# Patient Record
Sex: Male | Born: 1960 | Race: White | Hispanic: Yes | Marital: Married | Smoking: Never smoker
Health system: Southern US, Community
[De-identification: ages and names within clinical notes are randomized; demographics above are authoritative.]

## PROBLEM LIST (undated history)

## (undated) DIAGNOSIS — D649 Anemia, unspecified: Secondary | ICD-10-CM

## (undated) DIAGNOSIS — D696 Thrombocytopenia, unspecified: Secondary | ICD-10-CM

## (undated) DIAGNOSIS — Z87898 Personal history of other specified conditions: Secondary | ICD-10-CM

## (undated) DIAGNOSIS — R739 Hyperglycemia, unspecified: Secondary | ICD-10-CM

## (undated) DIAGNOSIS — K4020 Bilateral inguinal hernia, without obstruction or gangrene, not specified as recurrent: Secondary | ICD-10-CM

## (undated) DIAGNOSIS — T7840XA Allergy, unspecified, initial encounter: Secondary | ICD-10-CM

## (undated) HISTORY — DX: Bilateral inguinal hernia, without obstruction or gangrene, not specified as recurrent: K40.20

## (undated) HISTORY — DX: Hyperglycemia, unspecified: R73.9

## (undated) HISTORY — DX: Personal history of other specified conditions: Z87.898

## (undated) HISTORY — DX: Anemia, unspecified: D64.9

## (undated) HISTORY — DX: Allergy, unspecified, initial encounter: T78.40XA

## (undated) HISTORY — DX: Thrombocytopenia, unspecified: D69.6

---

## 1983-03-15 HISTORY — PX: EYE SURGERY: SHX253

## 2004-02-27 ENCOUNTER — Ambulatory Visit: Payer: Self-pay | Admitting: Family Medicine

## 2005-02-25 ENCOUNTER — Ambulatory Visit: Payer: Self-pay | Admitting: Family Medicine

## 2005-03-04 ENCOUNTER — Ambulatory Visit: Payer: Self-pay | Admitting: Family Medicine

## 2006-02-14 ENCOUNTER — Ambulatory Visit: Payer: Self-pay | Admitting: Family Medicine

## 2006-02-14 LAB — CONVERTED CEMR LAB
ALT: 25 units/L (ref 0–40)
Alkaline Phosphatase: 80 units/L (ref 39–117)
Basophils Absolute: 0 10*3/uL (ref 0.0–0.1)
Basophils Relative: 0.5 % (ref 0.0–1.0)
CO2: 30 meq/L (ref 19–32)
Calcium: 9.2 mg/dL (ref 8.4–10.5)
Chloride: 103 meq/L (ref 96–112)
Chol/HDL Ratio, serum: 3.2
GFR calc non Af Amer: 86 mL/min
Glomerular Filtration Rate, Af Am: 103 mL/min/{1.73_m2}
Glucose, Bld: 102 mg/dL — ABNORMAL HIGH (ref 70–99)
HDL: 58.6 mg/dL (ref >39.0)
LDL Cholesterol: 107 mg/dL — ABNORMAL HIGH (ref 0–99)
Lymphocytes Relative: 31 % (ref 12.0–46.0)
MCV: 86 fL (ref 78.0–100.0)
Monocytes Absolute: 0.7 10*3/uL (ref 0.2–0.7)
Monocytes Relative: 8.3 % (ref 3.0–11.0)
Neutro Abs: 4.8 10*3/uL (ref 1.4–7.7)
Platelets: 264 10*3/uL (ref 150–400)
RBC: 5.16 M/uL (ref 4.22–5.81)
Sodium: 140 meq/L (ref 135–145)
TSH: 1.66 microintl units/mL (ref 0.35–5.50)
VLDL: 22 mg/dL (ref 0–40)
WBC: 8.4 10*3/uL (ref 4.5–10.5)

## 2006-02-24 ENCOUNTER — Ambulatory Visit: Payer: Self-pay | Admitting: Family Medicine

## 2007-02-09 ENCOUNTER — Ambulatory Visit: Payer: Self-pay | Admitting: Family Medicine

## 2007-02-09 LAB — CONVERTED CEMR LAB
Nitrite: NEGATIVE
Specific Gravity, Urine: 1.02
Urobilinogen, UA: 0.2
WBC Urine, dipstick: NEGATIVE

## 2007-02-15 LAB — CONVERTED CEMR LAB
AST: 25 units/L (ref 0–37)
Albumin: 3.9 g/dL (ref 3.5–5.2)
Alkaline Phosphatase: 94 units/L (ref 39–117)
BUN: 18 mg/dL (ref 6–23)
Chloride: 100 meq/L (ref 96–112)
Creatinine, Ser: 0.8 mg/dL (ref 0.4–1.5)
GFR calc non Af Amer: 110 mL/min
HDL: 52.8 mg/dL (ref >39.0)
LDL Cholesterol: 102 mg/dL — ABNORMAL HIGH (ref 0–99)
Sodium: 138 meq/L (ref 135–145)
TSH: 1.6 microintl units/mL (ref 0.35–5.50)
VLDL: 24 mg/dL (ref 0–40)

## 2007-03-14 ENCOUNTER — Ambulatory Visit: Payer: Self-pay | Admitting: Family Medicine

## 2008-03-21 ENCOUNTER — Ambulatory Visit: Payer: Self-pay | Admitting: Family Medicine

## 2008-03-21 DIAGNOSIS — H109 Unspecified conjunctivitis: Secondary | ICD-10-CM | POA: Insufficient documentation

## 2008-03-21 DIAGNOSIS — B9789 Other viral agents as the cause of diseases classified elsewhere: Secondary | ICD-10-CM

## 2008-03-26 ENCOUNTER — Encounter: Payer: Self-pay | Admitting: Family Medicine

## 2008-03-26 DIAGNOSIS — R109 Unspecified abdominal pain: Secondary | ICD-10-CM | POA: Insufficient documentation

## 2008-03-27 LAB — CONVERTED CEMR LAB
AST: 81 units/L — ABNORMAL HIGH (ref 0–37)
Alkaline Phosphatase: 166 units/L — ABNORMAL HIGH (ref 39–117)
Basophils Absolute: 0.1 10*3/uL (ref 0.0–0.1)
Bilirubin, Direct: 0.1 mg/dL (ref 0.0–0.3)
CO2: 33 meq/L — ABNORMAL HIGH (ref 19–32)
Chloride: 106 meq/L (ref 96–112)
Cholesterol: 175 mg/dL (ref 0–200)
Eosinophils Absolute: 0.4 10*3/uL (ref 0.0–0.7)
GFR calc non Af Amer: 96 mL/min
HDL: 49.5 mg/dL (ref >39.0)
LDL Cholesterol: 110 mg/dL — ABNORMAL HIGH (ref 0–99)
Lymphocytes Relative: 24 % (ref 12.0–46.0)
MCHC: 34.4 g/dL (ref 30.0–36.0)
MCV: 84.8 fL (ref 78.0–100.0)
Neutrophils Relative %: 64.1 % (ref 43.0–77.0)
Platelets: 303 10*3/uL (ref 150–400)
Potassium: 4.7 meq/L (ref 3.5–5.1)
Total Bilirubin: 1.4 mg/dL — ABNORMAL HIGH (ref 0.3–1.2)
VLDL: 16 mg/dL (ref 0–40)
WBC: 9.7 10*3/uL (ref 4.5–10.5)

## 2008-04-02 ENCOUNTER — Telehealth: Payer: Self-pay | Admitting: Family Medicine

## 2008-04-04 ENCOUNTER — Ambulatory Visit (HOSPITAL_COMMUNITY): Admission: RE | Admit: 2008-04-04 | Discharge: 2008-04-04 | Payer: Self-pay | Admitting: Family Medicine

## 2009-03-14 HISTORY — PX: HERNIA REPAIR: SHX51

## 2009-08-21 IMAGING — US US ABDOMEN COMPLETE
1 series · 14 of 25 positions shown · non-contrast
Comparison: None

CLINICAL DATA: Abdominal pain.

ABDOMEN ULTRASOUND
TECHNIQUE: Complete abdominal ultrasound examination was performed
including evaluation of the liver, gallbladder, bile ducts,
pancreas, kidneys, spleen, IVC, and abdominal aorta.

[Series 1: unknown · 0.30mm/px · 14 of 60 slices shown]
[im 1/60]
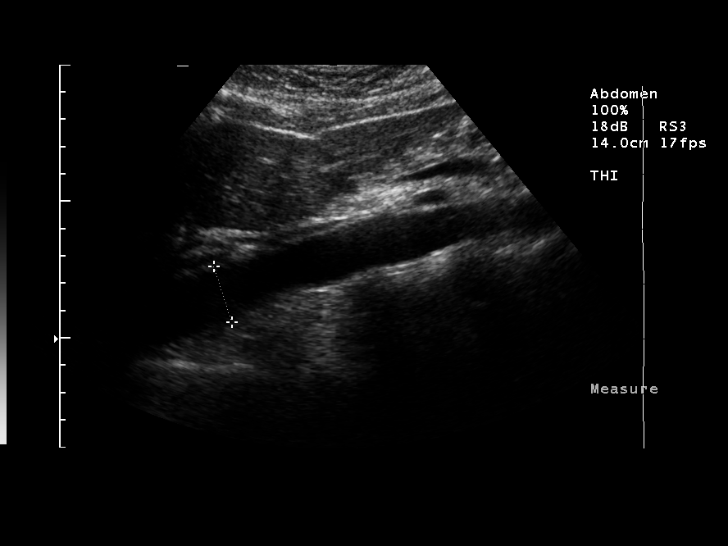
[im 5/60]
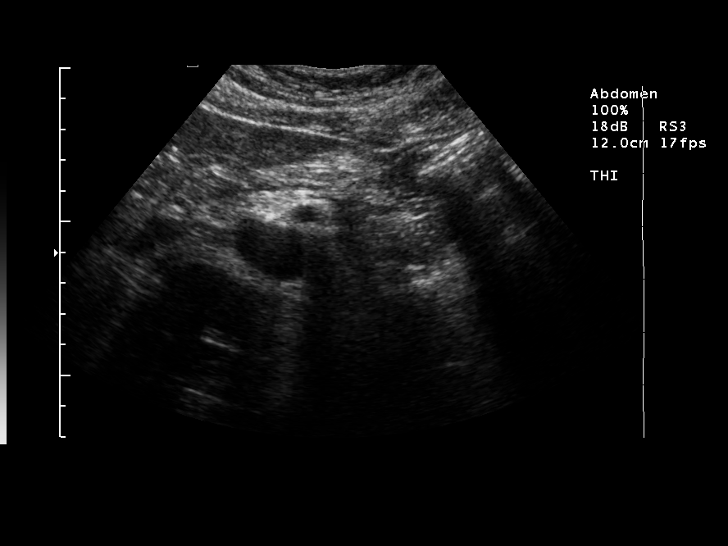
[im 10/60]
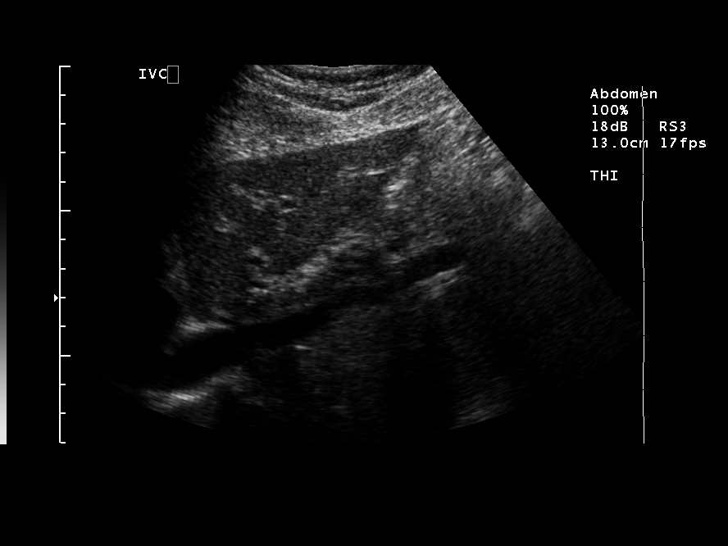
[im 15/60]
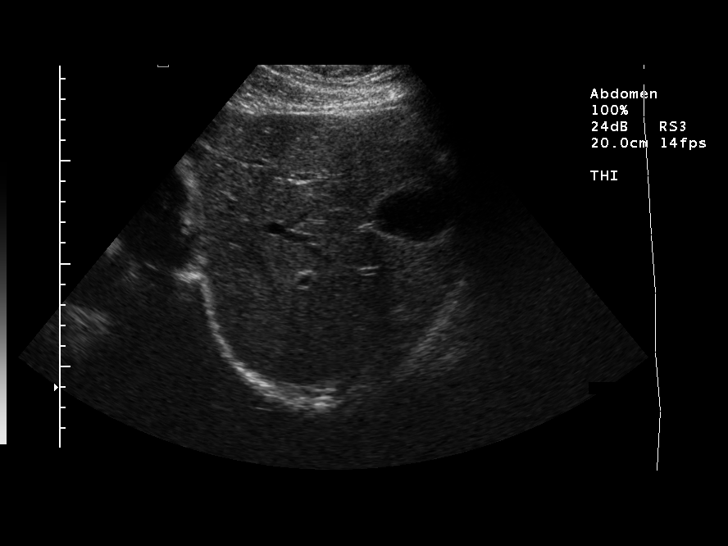
[im 20/60]
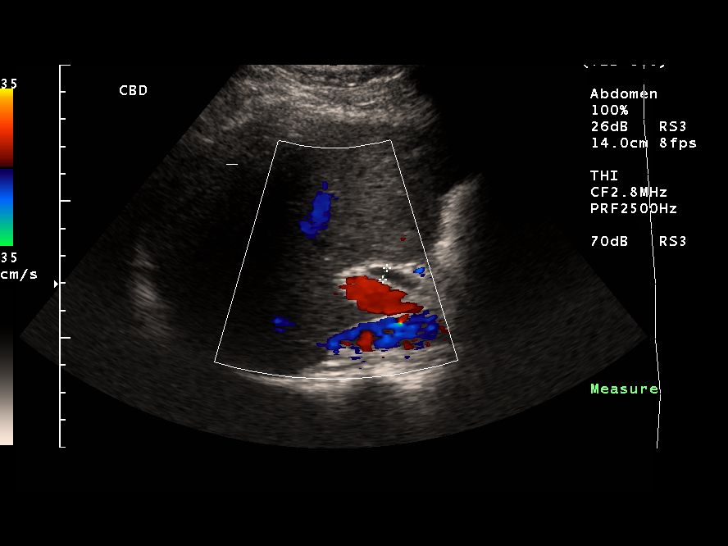
[im 23/60]
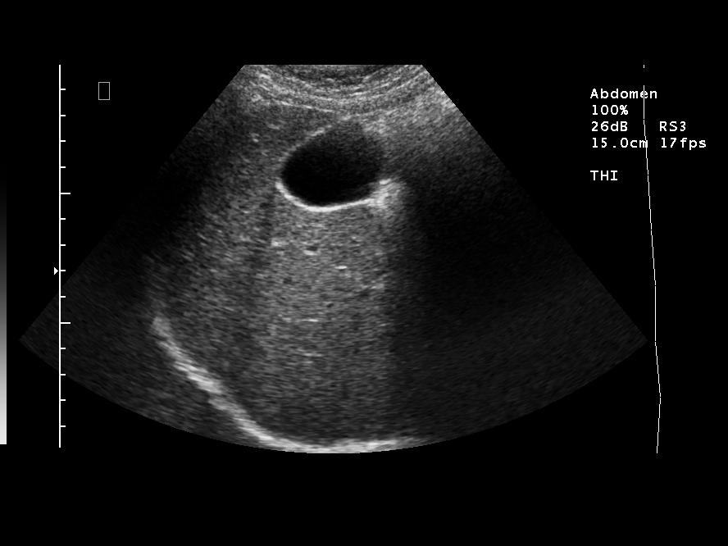
[im 28/60]
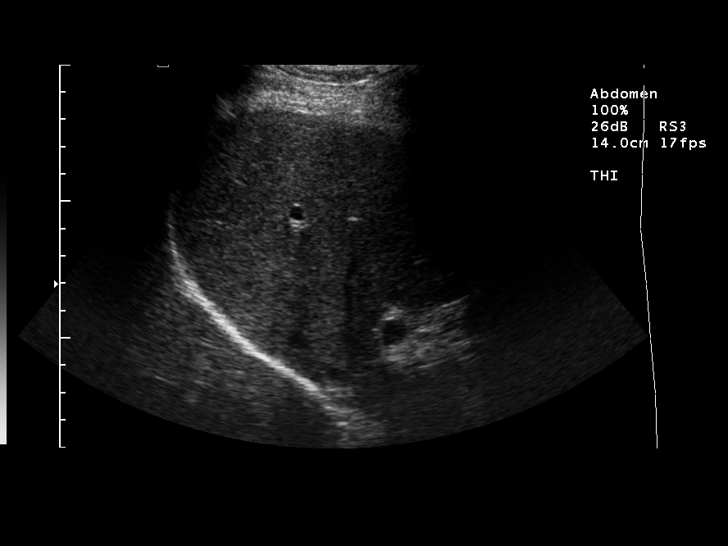
[im 32/60]
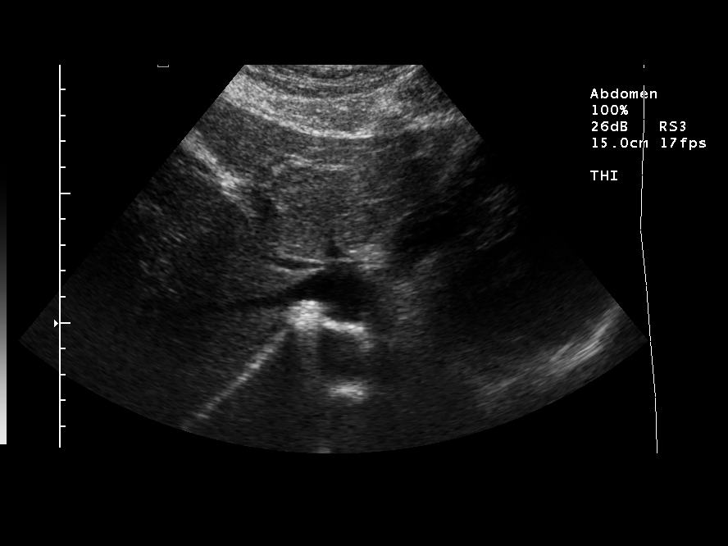
[im 37/60]
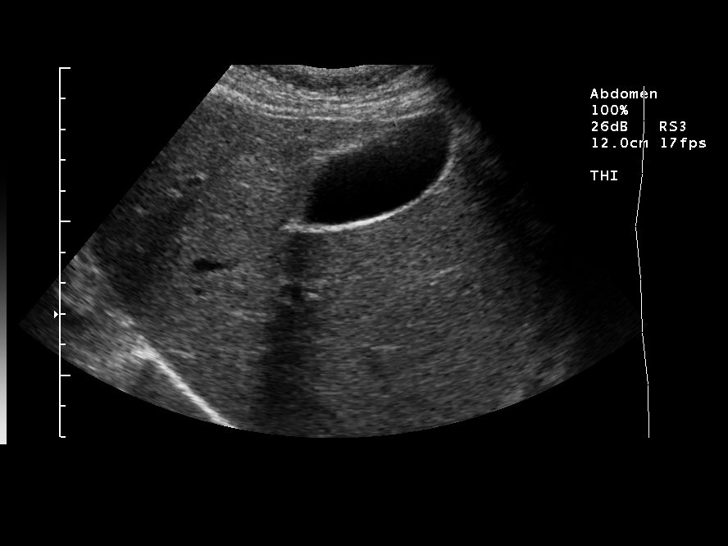
[im 40/60]
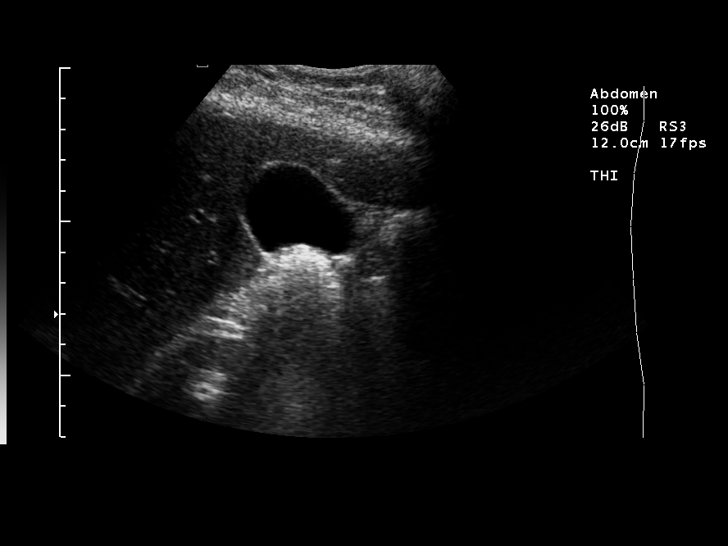
[im 45/60]
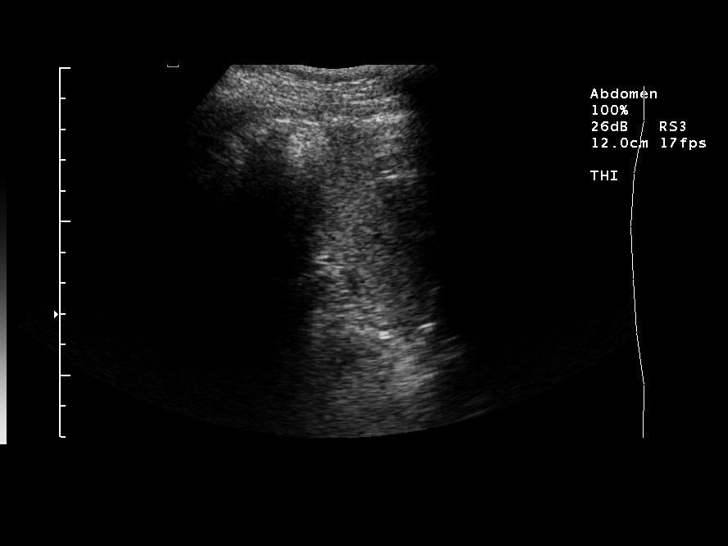
[im 50/60]
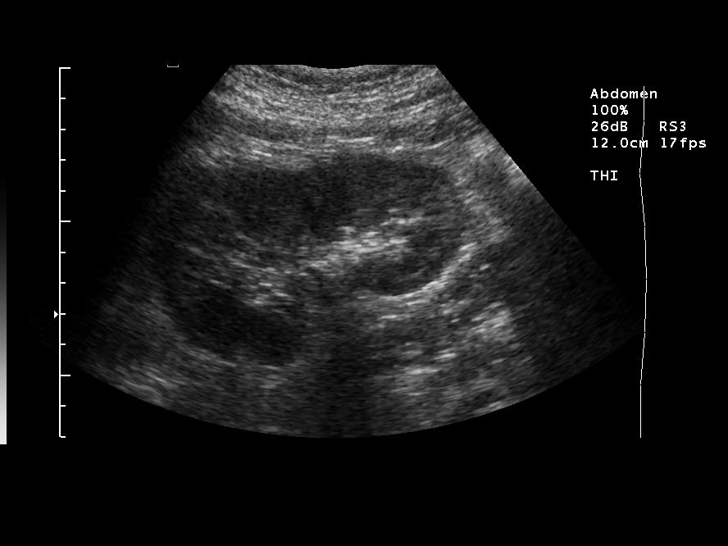
[im 55/60]
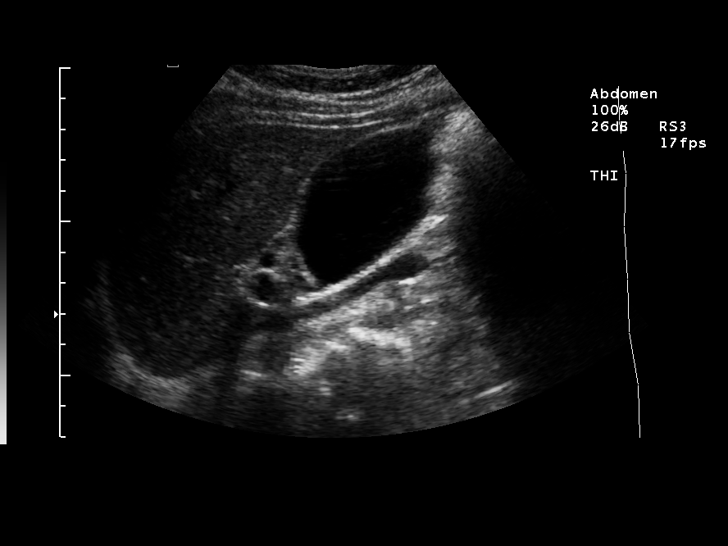
[im 60/60]
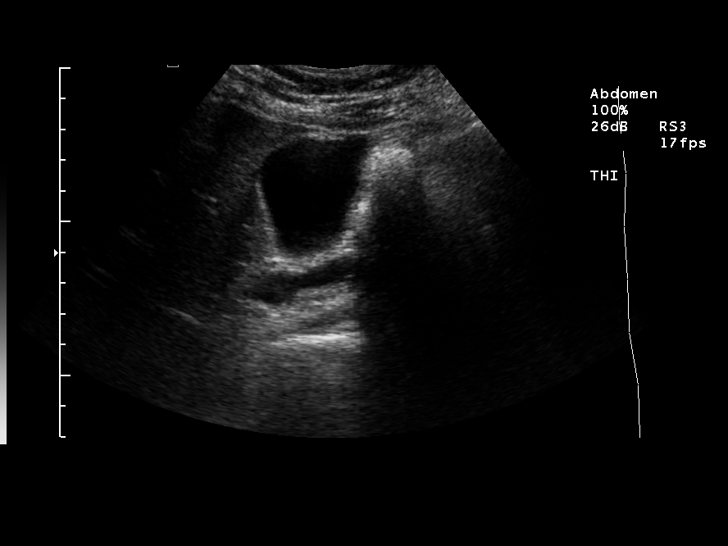

[14 of 25 positions shown; findings below may reference images not displayed]

FINDINGS: The gallbladder is well visualized and appears normal.
There are no dilated bile ducts.  Common bile duct is 4 mm in
diameter.

The liver parenchyma, inferior vena cava, pancreas, spleen,
kidneys, and abdominal aorta are normal.  The spleen is 5.3 cm in
length, the right kidney is 9.6 cm in length, and the left kidney
is 10.5 cm in length.  Maximal diameter of the abdominal aorta is
2.1 cm.
IMPRESSION: Normal abdominal ultrasound.

## 2010-07-30 NOTE — Assessment & Plan Note (Signed)
[facility] OFFICE NOTE   NAZARETH, Joseph Sexton                      MRN:          [id number]  DATE:02/24/2006                            DOB:          June 10, 1960    This is a 50 year old gentleman here for a complete physical  examination.  He feels well and has no complaints at all.  He has been  basically healthy with no ongoing problems.  For details of his past  medical history, family history, social history, habits, etc., refer to  our last physical, dated March 04, 2005.   ALLERGIES:  None.   MEDICATIONS:  None.   OBJECTIVE:  VITAL SIGNS:  Height 5 feet 4 inches, weight 145, BP 120/74,  pulse 88 and regular.  GENERAL:  He appears to be healthy.  SKIN:  Clear.  HEENT:  Eyes are clear, ears are clear, pharynx clear.  NECK:  Supple without lymphadenopathy or masses.  LUNGS:  Clear.  CARDIAC:  Rate and rhythm regular without gallops, murmurs, rubs.  Distal pulses full.  ABDOMEN:  Soft, normal bowel sounds, nontender, no masses.  GENITALIA:  Normal male.  EXTREMITIES:  No clubbing, cyanosis, or edema.  NEUROLOGIC:  Grossly intact.   He was here for fasting labs on December 4.  These were all within  normal limits.   ASSESSMENT AND PLAN:  Complete physical examination, which is  unremarkable.  Will plan on seeing him again in 1 year.     Tera Mater. Clent Ridges, MD  Electronically Signed    SAF/MedQ  DD: 02/27/2006  DT: 02/27/2006  Job #: [id number]

## 2010-09-12 LAB — HM COLONOSCOPY: HM COLON: NORMAL

## 2010-10-08 LAB — HM COLONOSCOPY

## 2011-03-15 LAB — HM COLONOSCOPY: HM COLON: NORMAL

## 2012-05-21 LAB — HEMOGLOBIN A1C: HEMOGLOBIN A1C: 6 % (ref 4.0–6.0)

## 2012-05-21 LAB — BASIC METABOLIC PANEL: Glucose: 107 mg/dL

## 2014-05-02 ENCOUNTER — Ambulatory Visit: Payer: Self-pay | Admitting: Internal Medicine

## 2014-05-22 ENCOUNTER — Telehealth: Payer: Self-pay | Admitting: *Deleted

## 2014-05-22 ENCOUNTER — Encounter: Payer: Self-pay | Admitting: *Deleted

## 2014-05-22 NOTE — Telephone Encounter (Signed)
Pre-Visit Call completed with patient and chart updated.   Pre-Visit Info documented in Specialty Comments under SnapShot.    

## 2014-05-23 ENCOUNTER — Ambulatory Visit (INDEPENDENT_AMBULATORY_CARE_PROVIDER_SITE_OTHER): Payer: 59 | Admitting: Internal Medicine

## 2014-05-23 ENCOUNTER — Encounter: Payer: Self-pay | Admitting: Internal Medicine

## 2014-05-23 VITALS — BP 128/72 | HR 77 | Temp 98.2°F | Ht 64.0 in | Wt 130.4 lb

## 2014-05-23 DIAGNOSIS — R739 Hyperglycemia, unspecified: Secondary | ICD-10-CM

## 2014-05-23 DIAGNOSIS — Z0000 Encounter for general adult medical examination without abnormal findings: Secondary | ICD-10-CM

## 2014-05-23 LAB — LIPID PANEL
CHOL/HDL RATIO: 2.3 ratio
Cholesterol: 160 mg/dL (ref 0–200)
HDL: 69 mg/dL (ref >=40)
LDL Cholesterol: 73 mg/dL (ref 0–99)
TRIGLYCERIDES: 92 mg/dL (ref <150)
VLDL: 18 mg/dL (ref 0–40)

## 2014-05-23 LAB — COMPREHENSIVE METABOLIC PANEL
ALBUMIN: 4.3 g/dL (ref 3.5–5.2)
ALT: 35 U/L (ref 0–53)
AST: 28 U/L (ref 0–37)
Alkaline Phosphatase: 75 U/L (ref 39–117)
BUN: 24 mg/dL — ABNORMAL HIGH (ref 6–23)
CALCIUM: 9.1 mg/dL (ref 8.4–10.5)
CHLORIDE: 101 meq/L (ref 96–112)
CO2: 28 meq/L (ref 19–32)
CREATININE: 0.96 mg/dL (ref 0.50–1.35)
Glucose, Bld: 91 mg/dL (ref 70–99)
POTASSIUM: 4.2 meq/L (ref 3.5–5.3)
Sodium: 138 mEq/L (ref 135–145)
Total Bilirubin: 0.7 mg/dL (ref 0.2–1.2)
Total Protein: 7 g/dL (ref 6.0–8.3)

## 2014-05-23 LAB — HEMOGLOBIN A1C
Hgb A1c MFr Bld: 5.8 % — ABNORMAL HIGH (ref <5.7)
Mean Plasma Glucose: 120 mg/dL — ABNORMAL HIGH (ref <117)

## 2014-05-23 LAB — TSH: TSH: 3.619 u[IU]/mL (ref 0.350–4.500)

## 2014-05-23 LAB — AST: AST: 28 U/L (ref 0–37)

## 2014-05-23 LAB — ALT: ALT: 35 U/L (ref 0–53)

## 2014-05-23 NOTE — Patient Instructions (Signed)
Get your blood work before you leave   Come back to the office in 1 year   for a physical exam  Please schedule an appointment at the front desk    Come back fasting       

## 2014-05-23 NOTE — Progress Notes (Signed)
Subjective:    Patient ID: Joseph Sexton, male    DOB: 07/30/1960, 54 y.o.   MRN: [id number]  DOS:  05/23/2014 Type of visit - description : nrew pt, CPX Interval history:    Review of Systems  Constitutional: No fever, chills. No unexplained wt changes. No unusual sweats HEENT: No dental problems, ear discharge, facial swelling, voice changes. No eye discharge, redness or intolerance to light; mild allergies on-off Respiratory: No wheezing or difficulty breathing. No cough , mucus production Cardiovascular: No CP, leg swelling or palpitations GI: no nausea, vomiting, diarrhea or abdominal pain.  No blood in the stools. No dysphagia   Endocrine: No polyphagia, polyuria or polydipsia GU: No dysuria, gross hematuria, difficulty urinating. No urinary urgency or frequency. Musculoskeletal: No joint swellings or unusual aches or pains Skin: No change in the color of the skin, palor or rash Allergic, immunologic: No environmental allergies or food allergies Neurological: occ episodes of vertigo-dizziness , no  syncope. No headaches. No diplopia, slurred speech, motor deficits, facial numbness Hematological: No enlarged lymph nodes, easy bruising or bleeding Psychiatry: No suicidal ideas, hallucinations, behavior problems or confusion. No unusual/severe anxiety or depression.    Past Medical History  Diagnosis Date  . Allergy     seasonal  . Anemia   . Hypoglycemia   . Thrombocytopenia   . H/O abnormal weight loss   . Inguinal hernia, bilateral     Past Surgical History  Procedure Laterality Date  . Hernia repair  2011  . Eye surgery  1985    unsure of exact surgery    History   Social History  . Marital Status: Married    Spouse Name: N/A  . Number of Children: N/A  . Years of Education: N/A   Occupational History  . Not on file.   Social History Main Topics  . Smoking status: Never Smoker   . Smokeless tobacco: Not on file  . Alcohol Use: 0.0 oz/week    0  Standard drinks or equivalent per week     Comment: Social Drinker  . Drug Use: No  . Sexual Activity: Not on file   Other Topics Concern  . Not on file   Social History Narrative     Family History  Problem Relation Age of Onset  . Hypertension Mother   . Heart disease Father [id number]  . Hypertension Sister   . CAD Neg Hx   . Diabetes Neg Hx   . Stroke Father [id number]  . Colon cancer Neg Hx   . Prostate cancer Neg Hx        Medication List       This list is accurate as of: 05/23/14  4:22 PM.  Always use your most recent med list.               B-12 1000 MCG Tbcr  Take by mouth.     cholecalciferol 1000 UNITS tablet  Commonly known as:  VITAMIN D  Take 1,000 Units by mouth daily.     Fish Oil 1200 MG Caps  Take by mouth.     JOINT FORMULA PO  Take 1 tablet by mouth daily.     multivitamin with minerals tablet  Take 1 tablet by mouth daily.     vitamin C 1000 MG tablet  Take 1,000 mg by mouth daily.           Objective:   Physical Exam BP 128/72 mmHg  Pulse 77  Temp(Src) 98.2 F (  36.8 C) (Oral)  Ht  (1.626 m)  Wt 130 lb 6 oz (59.138 kg)  BMI 22.37 kg/m2  SpO2 98% General:   Well developed, well nourished . NAD.  Neck:  Full range of motion. Supple. No  thyromegaly , normal carotid pulse HEENT:  Normocephalic . Face symmetric, atraumatic Lungs:  CTA B Normal respiratory effort, no intercostal retractions, no accessory muscle use. Heart: RRR,  no murmur.  Abdomen:  Not distended, soft, non-tender. No rebound or rigidity. No mass,organomegaly Muscle skeletal: no pretibial edema bilaterally  Skin: Exposed areas without rash. Not pale. Not jaundice pending Neurologic:  alert & oriented X3.  Speech normal, gait appropriate for age and unassisted Strength symmetric and appropriate for age.  Psych: Cognition and judgment appear intact.  Cooperative with normal attention span and concentration.  Behavior appropriate. No anxious or depressed  appearing.        Assessment & Plan:

## 2014-05-23 NOTE — Progress Notes (Signed)
Pre visit review using our clinic review tool, if applicable. No additional management support is needed unless otherwise documented below in the visit note. 

## 2014-05-23 NOTE — Assessment & Plan Note (Addendum)
Records from previous PCP reviewed:  tetanus shot 2009  colonoscopy 09-2010, Dr. Norma Fredricksonoledo  repeat in 10 years  Diet and exercise discussed Labs  history of hyperglycemia? Will check A1c

## 2014-05-24 LAB — CBC WITH DIFFERENTIAL/PLATELET
BASOS ABS: 0 10*3/uL (ref 0.0–0.1)
BASOS PCT: 0 % (ref 0–1)
Eosinophils Absolute: 0.1 10*3/uL (ref 0.0–0.7)
Eosinophils Relative: 1 % (ref 0–5)
HCT: 41.4 % (ref 39.0–52.0)
Hemoglobin: 13.9 g/dL (ref 13.0–17.0)
Lymphocytes Relative: 34 % (ref 12–46)
Lymphs Abs: 2.4 10*3/uL (ref 0.7–4.0)
MCH: 30.9 pg (ref 26.0–34.0)
MCHC: 33.6 g/dL (ref 30.0–36.0)
MCV: 92 fL (ref 78.0–100.0)
MONO ABS: 0.4 10*3/uL (ref 0.1–1.0)
MPV: 8.5 fL — ABNORMAL LOW (ref 8.6–12.4)
Monocytes Relative: 6 % (ref 3–12)
NEUTROS ABS: 4.2 10*3/uL (ref 1.7–7.7)
Neutrophils Relative %: 59 % (ref 43–77)
PLATELETS: 183 10*3/uL (ref 150–400)
RBC: 4.5 MIL/uL (ref 4.22–5.81)
RDW: 13.8 % (ref 11.5–15.5)
WBC: 7.2 10*3/uL (ref 4.0–10.5)

## 2014-05-24 LAB — PSA: PSA: 0.46 ng/mL (ref <=4.00)

## 2014-05-26 ENCOUNTER — Encounter: Payer: Self-pay | Admitting: Internal Medicine

## 2014-05-26 DIAGNOSIS — R739 Hyperglycemia, unspecified: Secondary | ICD-10-CM

## 2014-05-26 HISTORY — DX: Hyperglycemia, unspecified: R73.9

## 2015-03-03 ENCOUNTER — Telehealth: Payer: Self-pay | Admitting: Internal Medicine

## 2015-03-03 NOTE — Telephone Encounter (Signed)
Pt came in office and dropped off document to be filled out (2 Pages of Health Care Provider Confirmation of Physical Exam)

## 2015-03-04 NOTE — Telephone Encounter (Signed)
Filled out as much as possible and forwarded to Dr. Paz. JG//CMA  

## 2015-03-10 NOTE — Telephone Encounter (Signed)
Forms faxed successfully to [phone]. Originals placed up front for pt to pick up. Copy sent for scanning. JG//CMA

## 2015-06-09 ENCOUNTER — Telehealth: Payer: Self-pay | Admitting: Behavioral Health

## 2015-06-09 ENCOUNTER — Encounter: Payer: Self-pay | Admitting: Behavioral Health

## 2015-06-09 NOTE — Telephone Encounter (Signed)
Pre-Visit Call completed with patient and chart updated.   Pre-Visit Info documented in Specialty Comments under SnapShot.    

## 2015-06-10 ENCOUNTER — Ambulatory Visit (INDEPENDENT_AMBULATORY_CARE_PROVIDER_SITE_OTHER): Payer: 59 | Admitting: Internal Medicine

## 2015-06-10 ENCOUNTER — Encounter: Payer: Self-pay | Admitting: Internal Medicine

## 2015-06-10 VITALS — BP 126/66 | HR 53 | Temp 98.1°F | Ht 64.0 in | Wt 136.0 lb

## 2015-06-10 DIAGNOSIS — Z114 Encounter for screening for human immunodeficiency virus [HIV]: Secondary | ICD-10-CM | POA: Diagnosis not present

## 2015-06-10 DIAGNOSIS — Z0001 Encounter for general adult medical examination with abnormal findings: Secondary | ICD-10-CM

## 2015-06-10 DIAGNOSIS — Z0000 Encounter for general adult medical examination without abnormal findings: Secondary | ICD-10-CM

## 2015-06-10 DIAGNOSIS — Z09 Encounter for follow-up examination after completed treatment for conditions other than malignant neoplasm: Secondary | ICD-10-CM

## 2015-06-10 DIAGNOSIS — Z23 Encounter for immunization: Secondary | ICD-10-CM

## 2015-06-10 DIAGNOSIS — R399 Unspecified symptoms and signs involving the genitourinary system: Secondary | ICD-10-CM | POA: Diagnosis not present

## 2015-06-10 NOTE — Patient Instructions (Signed)
GO TO THE LAB :      Get the blood work     GO TO THE FRONT DESK Schedule your next appointment for a  physical When?   1 year Fasting?  Yes   

## 2015-06-10 NOTE — Assessment & Plan Note (Addendum)
TD-- 2009 pnm 23 - 3-17  colonoscopy 09-2010, Dr. Norma Fredricksonoledo  repeat in 10 years PSA wnl 2016, DRE today wnl Diet and exercise discussed Labs : Most recent FLP excellent, will check a HIV, BMP, A1c, TSH and urinalysis. RTC 1 year

## 2015-06-10 NOTE — Progress Notes (Signed)
Pre visit review using our clinic review tool, if applicable. No additional management support is needed unless otherwise documented below in the visit note. 

## 2015-06-10 NOTE — Progress Notes (Signed)
Subjective:    Patient ID: Joseph Sexton, male    DOB: Jan 26, 1961, 55 y.o.   MRN: [id number]  DOS:  06/10/2015 Type of visit - description :  Complete physical exam Interval history: No major concerns, feeling well    Review of Systems  Constitutional: No fever. No chills. No unexplained wt changes. No unusual sweats  HEENT: No dental problems, no ear discharge, no facial swelling, no voice changes. No eye discharge, no eye  redness , no  intolerance to light   Respiratory: No wheezing , no  difficulty breathing. No cough , no mucus production  Cardiovascular: No CP, no leg swelling , no  Palpitations  GI: no nausea, no vomiting, no diarrhea , no  abdominal pain.  No blood in the stools. No dysphagia, no odynophagia    Endocrine: No polyphagia, no polyuria , no polydipsia  GU: No dysuria, gross hematuria, difficulty urinating. For the last year has occasional nocturia and urinary urgency which is transient.  Musculoskeletal: No joint swellings or unusual aches or pains  Skin: No change in the color of the skin, palor , no  Rash  Allergic, immunologic: Allergies (nose, eyes) to pollen. OTCs help    Neurological: No dizziness no  syncope. No headaches. No diplopia, no slurred, no slurred speech, no motor deficits, no facial  Numbness  Hematological: No enlarged lymph nodes, no easy bruising , no unusual bleedings  Psychiatry: No suicidal ideas, no hallucinations, no beavior problems, no confusion.  No unusual/severe anxiety, no depression  Past Medical History  Diagnosis Date  . Allergy     seasonal  . Anemia   . Thrombocytopenia (HCC)     Per chart review  . H/O abnormal weight loss   . Inguinal hernia, bilateral   . Hyperglycemia 05/26/2014    Past Surgical History  Procedure Laterality Date  . Hernia repair  2011  . Eye surgery  1985    unsure of exact surgery    Social History   Social History  . Marital Status: Married    Spouse Name: N/A  . Number  of Children: 3  . Years of Education: N/A   Occupational History  . [employer]    Social History Main Topics  . Smoking status: Never Smoker   . Smokeless tobacco: Not on file  . Alcohol Use: 0.0 oz/week    0 Standard drinks or equivalent per week     Comment: Social Drinker  . Drug Use: No  . Sexual Activity: Not on file   Other Topics Concern  . Not on file   Social History Narrative   Born in [place] , in [place] since 1985     Family History  Problem Relation Age of Onset  . Hypertension Mother   . Heart disease Father 55  . Hypertension Sister   . Diabetes Neg Hx   . Stroke Father 58  . Colon cancer Neg Hx   . Prostate cancer Neg Hx        Medication List       This list is accurate as of: 06/10/15 11:59 PM.  Always use your most recent med list.               B-12 1000 MCG Tbcr  Take by mouth.     cholecalciferol 1000 units tablet  Commonly known as:  VITAMIN D  Take 1,000 Units by mouth daily.     Fish Oil 1200 MG Caps  Take by mouth.     JOINT FORMULA PO  Take 1 tablet by mouth daily.     multivitamin with minerals tablet  Take 1 tablet by mouth daily.     vitamin C 1000 MG tablet  Take 1,000 mg by mouth daily.           Objective:   Physical Exam BP 126/66 mmHg  Pulse 53  Temp(Src) 98.1 F (36.7 C) (Oral)  Ht 5\' 4"  (1.626 m)  Wt 136 lb (61.689 kg)  BMI 23.33 kg/m2  SpO2 97%  General:   Well developed, well nourished . NAD.  Neck: No  thyromegaly , normal carotid pulse HEENT:  Normocephalic . Face symmetric, atraumatic. Nose not congested, TMs normal, throat symmetric. Lungs:  CTA B Normal respiratory effort, no intercostal retractions, no accessory muscle use. Heart: RRR,  no murmur.  No pretibial edema bilaterally  Abdomen:  Not distended, soft, non-tender. No rebound or rigidity.   Rectal:  External abnormalities: none. Normal sphincter tone. No rectal masses or tenderness.  Stool brown    Prostate: Prostate gland firm and smooth, no enlargement, nodularity, tenderness, mass, asymmetry or induration.  Skin: Exposed areas without rash. Not pale. Not jaundice Neurologic:  alert & oriented X3.  Speech normal, gait appropriate for age and unassisted Strength symmetric and appropriate for age.  Psych: Cognition and judgment appear intact.  Cooperative with normal attention span and concentration.  Behavior appropriate. No anxious or depressed appearing.    Assessment & Plan:   Assessment Prediabetes A1c 6.0 05-2012 Allergic rhinitis  PLAN: Prediabetes: Diet and exercise discussed although he is doing very well. Check A1c Allergies: Responding well to OTCs. Mild LUTS: PSA last year normal, DRE negative today, recommend to avoid bladder irritants, drink plenty of fluids and call if sx increase. RTC 1 year

## 2015-06-11 DIAGNOSIS — Z09 Encounter for follow-up examination after completed treatment for conditions other than malignant neoplasm: Secondary | ICD-10-CM | POA: Insufficient documentation

## 2015-06-11 LAB — URINALYSIS, ROUTINE W REFLEX MICROSCOPIC
Bilirubin Urine: NEGATIVE
HGB URINE DIPSTICK: NEGATIVE
Ketones, ur: NEGATIVE
Leukocytes, UA: NEGATIVE
NITRITE: NEGATIVE
RBC / HPF: NONE SEEN (ref 0–?)
Specific Gravity, Urine: 1.025 (ref 1.000–1.030)
Total Protein, Urine: NEGATIVE
URINE GLUCOSE: NEGATIVE
Urobilinogen, UA: 0.2 (ref 0.0–1.0)
pH: 6 (ref 5.0–8.0)

## 2015-06-11 LAB — BASIC METABOLIC PANEL
BUN: 25 mg/dL — ABNORMAL HIGH (ref 6–23)
CALCIUM: 9.2 mg/dL (ref 8.4–10.5)
CO2: 30 mEq/L (ref 19–32)
CREATININE: 0.94 mg/dL (ref 0.40–1.50)
Chloride: 101 mEq/L (ref 96–112)
GFR: 88.16 mL/min (ref >60.00)
Glucose, Bld: 91 mg/dL (ref 70–99)
Potassium: 4 mEq/L (ref 3.5–5.1)
SODIUM: 137 meq/L (ref 135–145)

## 2015-06-11 LAB — HIV ANTIBODY (ROUTINE TESTING W REFLEX): HIV 1&2 Ab, 4th Generation: NONREACTIVE

## 2015-06-11 LAB — TSH: TSH: 2.7 u[IU]/mL (ref 0.35–4.50)

## 2015-06-11 LAB — HEMOGLOBIN A1C: Hgb A1c MFr Bld: 5.9 % (ref 4.6–6.5)

## 2015-06-11 NOTE — Assessment & Plan Note (Signed)
Prediabetes: Diet and exercise discussed although he is doing very well. Check A1c Allergies: Responding well to OTCs. Mild LUTS: PSA last year normal, DRE negative today, recommend to avoid bladder irritants, drink plenty of fluids and call if sx increase. RTC 1 year

## 2015-12-07 ENCOUNTER — Telehealth: Payer: Self-pay | Admitting: Internal Medicine

## 2015-12-07 NOTE — Telephone Encounter (Signed)
Pt's wife Joseph Sexton(Carmen) dropped off document to be filled out for spouse. (Document [employer]). Document put at front office tray.

## 2015-12-09 NOTE — Telephone Encounter (Signed)
Spoke w/ Pt, informed him form has been completed and placed at front desk for pick up at his convenience. Pt verbalized understanding. Copy of form sent for scanning.

## 2016-06-13 ENCOUNTER — Encounter: Payer: Self-pay | Admitting: Internal Medicine

## 2016-06-13 ENCOUNTER — Ambulatory Visit (INDEPENDENT_AMBULATORY_CARE_PROVIDER_SITE_OTHER): Payer: 59 | Admitting: Internal Medicine

## 2016-06-13 ENCOUNTER — Encounter: Payer: 59 | Admitting: Internal Medicine

## 2016-06-13 VITALS — BP 126/70 | HR 56 | Temp 97.8°F | Resp 14 | Ht 64.0 in | Wt 138.1 lb

## 2016-06-13 DIAGNOSIS — Z0000 Encounter for general adult medical examination without abnormal findings: Secondary | ICD-10-CM | POA: Diagnosis not present

## 2016-06-13 DIAGNOSIS — R739 Hyperglycemia, unspecified: Secondary | ICD-10-CM

## 2016-06-13 DIAGNOSIS — D696 Thrombocytopenia, unspecified: Secondary | ICD-10-CM

## 2016-06-13 DIAGNOSIS — Z1159 Encounter for screening for other viral diseases: Secondary | ICD-10-CM

## 2016-06-13 NOTE — Progress Notes (Signed)
Subjective:    Patient ID: Joseph Sexton, male    DOB: [birth year], 56 y.o.   MRN: [id number]  DOS:  06/13/2016 Type of visit - description : cpx Interval history: No major concerns   Review of Systems Few months ago, he got a splinter on the right index, he remove it, wonders if he still has a piece of wood there because it hurts from time to time. No redness, swelling or discharge   Other than above, a 14 point review of systems is negative     Past Medical History:  Diagnosis Date  . Allergy    seasonal  . Anemia   . H/O abnormal weight loss   . Hyperglycemia 05/26/2014  . Inguinal hernia, bilateral   . Thrombocytopenia (HCC)    Per chart review    Past Surgical History:  Procedure Laterality Date  . EYE SURGERY  1985   unsure of exact surgery  . HERNIA REPAIR Bilateral 2011    Social History   Social History  . Marital status: Married    Spouse name: N/A  . Number of children: 3  . Years of education: N/A   Occupational History  . [employer]    Social History Main Topics  . Smoking status: Never Smoker  . Smokeless tobacco: Never Used  . Alcohol use 0.0 oz/week     Comment: Social Drinker  . Drug use: No  . Sexual activity: Not on file   Other Topics Concern  . Not on file   Social History Narrative   Born in [place] , in [place] since 1985   2 daughters, one son     Family History  Problem Relation Age of Onset  . Hypertension Mother   . Heart disease Father 60  . Stroke Father 11  . Hypertension Sister   . Diabetes Neg Hx   . Colon cancer Neg Hx   . Prostate cancer Neg Hx      Allergies as of 06/13/2016      Reactions   Pollen Extract       Medication List       Accurate as of 06/13/16 11:59 PM. Always use your most recent med list.          B-12 1000 MCG Tbcr Take by mouth.   cholecalciferol 1000 units tablet Commonly known as:  VITAMIN D Take 1,000 Units by mouth daily.   Fish Oil 1200 MG Caps Take  by mouth.   JOINT FORMULA PO Take 1 tablet by mouth daily.   multivitamin with minerals tablet Take 1 tablet by mouth daily.   vitamin C 1000 MG tablet Take 1,000 mg by mouth daily.          Objective:   Physical Exam BP 126/70 (BP Location: Right Arm, Patient Position: Sitting, Cuff Size: Small)   Pulse (!) 56   Temp 97.8 F (36.6 C) (Oral)   Resp 14   Ht  (1.626 m)   Wt 138 lb 2 oz (62.7 kg)   SpO2 97%   BMI 23.71 kg/m   General:   Well developed, well nourished . NAD.  Neck: No  thyromegaly  HEENT:  Normocephalic . Face symmetric, atraumatic Lungs:  CTA B Normal respiratory effort, no intercostal retractions, no accessory muscle use. Heart: RRR,  no murmur.  No pretibial edema bilaterally  Abdomen:  Not distended, soft, non-tender. No rebound or rigidity.   Skin: Exposed areas without rash. Not pale.  Not jaundice MSK: Right index: Normal to inspection on palpation Neurologic:  alert & oriented X3.  Speech normal, gait appropriate for age and unassisted Strength symmetric and appropriate for age.  Psych: Cognition and judgment appear intact.  Cooperative with normal attention span and concentration.  Behavior appropriate. No anxious or depressed appearing.    Assessment & Plan:   Assessment Prediabetes A1c 6.0 05-2012 Allergic rhinitis  PLAN: Prediabetes: Diet control, check A1c Mild LUTS: Asx this year, denies any GU symptoms. RTC one year

## 2016-06-13 NOTE — Progress Notes (Signed)
Pre visit review using our clinic review tool, if applicable. No additional management support is needed unless otherwise documented below in the visit note. 

## 2016-06-13 NOTE — Patient Instructions (Signed)
GO TO THE LAB : Get the blood work     GO TO THE FRONT DESK Schedule your next appointment for a  Physical exam in 1 year 

## 2016-06-13 NOTE — Assessment & Plan Note (Addendum)
--  Td-- 2009; pnm 23: 3-17 --CCS: colonoscopy ~ 09-2010, Dr. Norma Fredrickson  repeat in 10 years --DRE wnl 2017, check a PSA  --Diet and exercise discussed --Labs : CMP, cholesterol panel, CBC, A1c, TSH, hep C. He is not fasting, if cholesterol elevated will come back fasting.

## 2016-06-14 LAB — CBC WITH DIFFERENTIAL/PLATELET
BASOS ABS: 0.1 10*3/uL (ref 0.0–0.1)
Basophils Relative: 0.8 % (ref 0.0–3.0)
EOS PCT: 0.7 % (ref 0.0–5.0)
Eosinophils Absolute: 0 10*3/uL (ref 0.0–0.7)
HEMATOCRIT: 38.1 % — AB (ref 39.0–52.0)
Hemoglobin: 12.9 g/dL — ABNORMAL LOW (ref 13.0–17.0)
LYMPHS ABS: 1.8 10*3/uL (ref 0.7–4.0)
LYMPHS PCT: 27.7 % (ref 12.0–46.0)
MCHC: 33.7 g/dL (ref 30.0–36.0)
MCV: 93 fl (ref 78.0–100.0)
MONOS PCT: 8.1 % (ref 3.0–12.0)
Monocytes Absolute: 0.5 10*3/uL (ref 0.1–1.0)
NEUTROS ABS: 4.2 10*3/uL (ref 1.4–7.7)
Neutrophils Relative %: 62.7 % (ref 43.0–77.0)
PLATELETS: 228 10*3/uL (ref 150.0–400.0)
RBC: 4.1 Mil/uL — AB (ref 4.22–5.81)
RDW: 14.9 % (ref 11.5–15.5)
WBC: 6.6 10*3/uL (ref 4.0–10.5)

## 2016-06-14 LAB — COMPREHENSIVE METABOLIC PANEL
ALK PHOS: 74 U/L (ref 39–117)
ALT: 15 U/L (ref 0–53)
AST: 18 U/L (ref 0–37)
Albumin: 4.1 g/dL (ref 3.5–5.2)
BILIRUBIN TOTAL: 0.5 mg/dL (ref 0.2–1.2)
BUN: 18 mg/dL (ref 6–23)
CO2: 30 mEq/L (ref 19–32)
Calcium: 8.9 mg/dL (ref 8.4–10.5)
Chloride: 104 mEq/L (ref 96–112)
Creatinine, Ser: 0.79 mg/dL (ref 0.40–1.50)
GFR: 107.36 mL/min (ref >60.00)
GLUCOSE: 95 mg/dL (ref 70–99)
Potassium: 4.3 mEq/L (ref 3.5–5.1)
SODIUM: 140 meq/L (ref 135–145)
TOTAL PROTEIN: 6.4 g/dL (ref 6.0–8.3)

## 2016-06-14 LAB — LIPID PANEL
Cholesterol: 158 mg/dL (ref 0–200)
HDL: 59.3 mg/dL (ref >39.00)
LDL Cholesterol: 68 mg/dL (ref 0–99)
NONHDL: 98.22
Total CHOL/HDL Ratio: 3
Triglycerides: 149 mg/dL (ref 0.0–149.0)
VLDL: 29.8 mg/dL (ref 0.0–40.0)

## 2016-06-14 LAB — HEMOGLOBIN A1C: Hgb A1c MFr Bld: 5.9 % (ref 4.6–6.5)

## 2016-06-14 LAB — PSA: PSA: 0.32 ng/mL (ref 0.10–4.00)

## 2016-06-14 LAB — HEPATITIS C ANTIBODY: HCV AB: NEGATIVE

## 2016-06-14 NOTE — Assessment & Plan Note (Signed)
Prediabetes: Diet control, check A1c Mild LUTS: Asx this year, denies any GU symptoms. RTC one year

## 2016-09-30 ENCOUNTER — Telehealth: Payer: Self-pay | Admitting: Internal Medicine

## 2016-09-30 NOTE — Telephone Encounter (Signed)
Pt dropped off physical form for Dr. Drue NovelPaz to fill out, pt will pick up when available, documents placed in tray at front office.

## 2016-10-03 NOTE — Telephone Encounter (Signed)
Health Care Provider Confirmation of Physical Exam, completed as much as possible; forwarded to provider/SLS 07/23

## 2016-10-07 NOTE — Telephone Encounter (Signed)
Informed Pt that form has been completed and placed at front desk for pick up at his convenience. Copy of forms sent for scanning.

## 2017-06-16 ENCOUNTER — Encounter: Payer: Self-pay | Admitting: Internal Medicine

## 2017-06-16 ENCOUNTER — Ambulatory Visit (INDEPENDENT_AMBULATORY_CARE_PROVIDER_SITE_OTHER): Payer: 59 | Admitting: Internal Medicine

## 2017-06-16 ENCOUNTER — Other Ambulatory Visit: Payer: Self-pay

## 2017-06-16 VITALS — BP 126/60 | HR 61 | Temp 98.0°F | Resp 14 | Ht 64.0 in | Wt 135.1 lb

## 2017-06-16 DIAGNOSIS — Z0000 Encounter for general adult medical examination without abnormal findings: Secondary | ICD-10-CM | POA: Diagnosis not present

## 2017-06-16 DIAGNOSIS — Z23 Encounter for immunization: Secondary | ICD-10-CM

## 2017-06-16 DIAGNOSIS — R739 Hyperglycemia, unspecified: Secondary | ICD-10-CM | POA: Diagnosis not present

## 2017-06-16 DIAGNOSIS — Z09 Encounter for follow-up examination after completed treatment for conditions other than malignant neoplasm: Secondary | ICD-10-CM

## 2017-06-16 DIAGNOSIS — E875 Hyperkalemia: Secondary | ICD-10-CM

## 2017-06-16 LAB — CBC WITH DIFFERENTIAL/PLATELET
Basophils Absolute: 0 10*3/uL (ref 0.0–0.1)
Basophils Relative: 0.4 % (ref 0.0–3.0)
EOS ABS: 0.1 10*3/uL (ref 0.0–0.7)
Eosinophils Relative: 1.1 % (ref 0.0–5.0)
HCT: 38.2 % — ABNORMAL LOW (ref 39.0–52.0)
HEMOGLOBIN: 13 g/dL (ref 13.0–17.0)
LYMPHS ABS: 1.8 10*3/uL (ref 0.7–4.0)
Lymphocytes Relative: 34.6 % (ref 12.0–46.0)
MCHC: 34.1 g/dL (ref 30.0–36.0)
MCV: 91.7 fl (ref 78.0–100.0)
MONO ABS: 0.4 10*3/uL (ref 0.1–1.0)
Monocytes Relative: 8 % (ref 3.0–12.0)
NEUTROS PCT: 55.9 % (ref 43.0–77.0)
Neutro Abs: 2.9 10*3/uL (ref 1.4–7.7)
PLATELETS: 286 10*3/uL (ref 150.0–400.0)
RBC: 4.16 Mil/uL — ABNORMAL LOW (ref 4.22–5.81)
RDW: 14.8 % (ref 11.5–15.5)
WBC: 5.2 10*3/uL (ref 4.0–10.5)

## 2017-06-16 LAB — HEMOGLOBIN A1C: HEMOGLOBIN A1C: 6 % (ref 4.6–6.5)

## 2017-06-16 LAB — BASIC METABOLIC PANEL
BUN: 23 mg/dL (ref 6–23)
CHLORIDE: 103 meq/L (ref 96–112)
CO2: 32 mEq/L (ref 19–32)
Calcium: 9.4 mg/dL (ref 8.4–10.5)
Creatinine, Ser: 0.91 mg/dL (ref 0.40–1.50)
GFR: 90.87 mL/min (ref >60.00)
GLUCOSE: 88 mg/dL (ref 70–99)
POTASSIUM: 5.6 meq/L — AB (ref 3.5–5.1)
SODIUM: 140 meq/L (ref 135–145)

## 2017-06-16 LAB — LIPID PANEL
CHOLESTEROL: 152 mg/dL (ref 0–200)
HDL: 67.1 mg/dL (ref >39.00)
LDL CALC: 70 mg/dL (ref 0–99)
NonHDL: 84.48
Total CHOL/HDL Ratio: 2
Triglycerides: 71 mg/dL (ref 0.0–149.0)
VLDL: 14.2 mg/dL (ref 0.0–40.0)

## 2017-06-16 NOTE — Assessment & Plan Note (Addendum)
--  Td: [phone] pnm 23: 2017 --CCS: colonoscopy ~ 09-2010, Dr. Norma Fredricksonoledo  repeat in 10 years --Prostate cancer screening DRE wnl 2017, PSA 2018 normal.  No FH, no symptoms.  Reassess next year --Diet and exercise discussed --Labs : BMP, FLP, CBC, A1c

## 2017-06-16 NOTE — Progress Notes (Signed)
Pre visit review using our clinic review tool, if applicable. No additional management support is needed unless otherwise documented below in the visit note. 

## 2017-06-16 NOTE — Patient Instructions (Signed)
GO TO THE LAB : Get the blood work     GO TO THE FRONT DESK Schedule your next appointment   for a physical exam in 1 year 

## 2017-06-16 NOTE — Progress Notes (Signed)
Subjective:    Patient ID: Joseph Sexton, male    DOB: 02/21/1961, 57 y.o.   MRN: [id number]  DOS:  06/16/2017 Type of visit - description : cpx Interval history: Feels well, no concerns  Wt Readings from Last 3 Encounters:  06/16/17 135 lb 2 oz (61.3 kg)  06/13/16 138 lb 2 oz (62.7 kg)  06/10/15 136 lb (61.7 kg)   Review of Systems  A 14 point review of systems is negative    Past Medical History:  Diagnosis Date  . Allergy    seasonal  . Anemia   . H/O abnormal weight loss   . Hyperglycemia 05/26/2014  . Inguinal hernia, bilateral   . Thrombocytopenia (HCC)    Per chart review    Past Surgical History:  Procedure Laterality Date  . EYE SURGERY  1985   unsure of exact surgery  . HERNIA REPAIR Bilateral 2011    Social History   Socioeconomic History  . Marital status: Married    Spouse name: Not on file  . Number of children: 3  . Years of education: Not on file  . Highest education level: Not on file  Occupational History  . Occupation: [employer]  Social Needs  . Financial resource strain: Not on file  . Food insecurity:    Worry: Not on file    Inability: Not on file  . Transportation needs:    Medical: Not on file    Non-medical: Not on file  Tobacco Use  . Smoking status: Never Smoker  . Smokeless tobacco: Never Used  Substance and Sexual Activity  . Alcohol use: Yes    Alcohol/week: 0.0 oz    Comment: Social Drinker  . Drug use: No  . Sexual activity: Not on file  Lifestyle  . Physical activity:    Days per week: Not on file    Minutes per session: Not on file  . Stress: Not on file  Relationships  . Social connections:    Talks on phone: Not on file    Gets together: Not on file    Attends religious service: Not on file    Active member of club or organization: Not on file    Attends meetings of clubs or organizations: Not on file    Relationship status: Not on file  . Intimate partner violence:    Fear of current or  ex partner: Not on file    Emotionally abused: Not on file    Physically abused: Not on file    Forced sexual activity: Not on file  Other Topics Concern  . Not on file  Social History Narrative   Born [place] , [place] since [place]   2 daughters, one son     Family History  Problem Relation Age of Onset  . Hypertension Mother   . Heart disease Father [id number]  . Stroke Father 6776  . Hypertension Sister   . Diabetes Neg Hx   . Colon cancer Neg Hx   . Prostate cancer Neg Hx      Allergies as of 06/16/2017      Reactions   Pollen Extract       Medication List        Accurate as of 06/16/17 11:59 PM. Always use your most recent med list.          B-12 1000 MCG Tbcr Take by mouth.   cholecalciferol 1000 units tablet Commonly known as:  VITAMIN D  Take 1,000 Units by mouth daily.   Fish Oil 1200 MG Caps Take by mouth.   JOINT FORMULA PO Take 1 tablet by mouth daily.   multivitamin with minerals tablet Take 1 tablet by mouth daily.   vitamin C 1000 MG tablet Take 1,000 mg by mouth daily.          Objective:   Physical Exam BP 126/60 (BP Location: Left Arm, Patient Position: Sitting, Cuff Size: Small)   Pulse 61   Temp 98 F (36.7 C) (Oral)   Resp 14   Ht 5\' 4"  (1.626 m)   Wt 135 lb 2 oz (61.3 kg)   SpO2 96%   BMI 23.19 kg/m  General:   Well developed, well nourished . NAD.  Neck: No  thyromegaly  HEENT:  Normocephalic . Face symmetric, atraumatic Lungs:  CTA B Normal respiratory effort, no intercostal retractions, no accessory muscle use. Heart: RRR,  no murmur.  No pretibial edema bilaterally  Abdomen:  Not distended, soft, non-tender. No rebound or rigidity.  Palpable, nontender aorta at the upper abdomen, no bruit Skin: Exposed areas without rash. Not pale. Not jaundice Neurologic:  alert & oriented X3.  Speech normal, gait appropriate for age and unassisted Strength symmetric and appropriate for age.  Psych: Cognition and judgment  appear intact.  Cooperative with normal attention span and concentration.  Behavior appropriate. No anxious or depressed appearing.     Assessment & Plan:   Assessment Prediabetes A1c 6.0 05-2012 Allergic rhinitis H/o thrombocytopenia Palpable aorta (US abdomen 2010, normal aorta)  PLAN: DM:: Diet discussed Allergic rhinitis, seasonal, reports that he has good response with OTCs Palpable aorta: Doubt AAA, likely due to patient habitus RTC 1 year

## 2017-06-18 NOTE — Assessment & Plan Note (Signed)
DM:: Diet discussed Allergic rhinitis, seasonal, reports that he has good response with OTCs Palpable aorta: Doubt AAA, likely due to patient habitus RTC 1 year

## 2017-06-20 ENCOUNTER — Telehealth: Payer: Self-pay | Admitting: Internal Medicine

## 2017-06-20 ENCOUNTER — Other Ambulatory Visit (INDEPENDENT_AMBULATORY_CARE_PROVIDER_SITE_OTHER): Payer: 59

## 2017-06-20 DIAGNOSIS — E875 Hyperkalemia: Secondary | ICD-10-CM | POA: Diagnosis not present

## 2017-06-20 LAB — BASIC METABOLIC PANEL
BUN: 26 mg/dL — AB (ref 6–23)
CALCIUM: 9 mg/dL (ref 8.4–10.5)
CHLORIDE: 103 meq/L (ref 96–112)
CO2: 30 mEq/L (ref 19–32)
CREATININE: 0.86 mg/dL (ref 0.40–1.50)
GFR: 96.99 mL/min (ref >60.00)
Glucose, Bld: 110 mg/dL — ABNORMAL HIGH (ref 70–99)
Potassium: 5.1 mEq/L (ref 3.5–5.1)
Sodium: 139 mEq/L (ref 135–145)

## 2017-06-20 NOTE — Telephone Encounter (Signed)
Copied from CRM [id number]. Topic: Quick Communication - See Telephone Encounter >> Jun 20, 2017  7:29 AM Valentina LucksMatos, Jackelin wrote: CRM for notification. See Telephone encounter for: 06/20/17.   Pt dropped off document to be filled out by provider ( 1 page of [employer]) Pt would like document to be faxed to [phone] and to call pt when document has been faxed. Document put at front office tray under providers name.

## 2017-06-21 NOTE — Telephone Encounter (Signed)
Form completed and faxed with confirmation/SLS 04/10

## 2017-06-21 NOTE — Telephone Encounter (Signed)
Completed as much as possible; forward to provider [not states that it needs to be signed]/SLS 04/10

## 2017-07-28 DIAGNOSIS — H40019 Open angle with borderline findings, low risk, unspecified eye: Secondary | ICD-10-CM | POA: Diagnosis not present

## 2017-10-06 DIAGNOSIS — H40013 Open angle with borderline findings, low risk, bilateral: Secondary | ICD-10-CM | POA: Diagnosis not present

## 2018-07-30 DIAGNOSIS — H40013 Open angle with borderline findings, low risk, bilateral: Secondary | ICD-10-CM | POA: Diagnosis not present

## 2018-10-09 ENCOUNTER — Encounter: Payer: Self-pay | Admitting: Internal Medicine

## 2019-01-17 ENCOUNTER — Other Ambulatory Visit: Payer: Self-pay

## 2019-01-18 ENCOUNTER — Other Ambulatory Visit: Payer: Self-pay

## 2019-01-18 ENCOUNTER — Encounter: Payer: Self-pay | Admitting: Internal Medicine

## 2019-01-18 ENCOUNTER — Ambulatory Visit (INDEPENDENT_AMBULATORY_CARE_PROVIDER_SITE_OTHER): Payer: 59 | Admitting: Internal Medicine

## 2019-01-18 VITALS — BP 122/75 | HR 87 | Temp 98.2°F | Resp 16 | Ht 64.0 in | Wt 137.4 lb

## 2019-01-18 DIAGNOSIS — Z23 Encounter for immunization: Secondary | ICD-10-CM | POA: Diagnosis not present

## 2019-01-18 DIAGNOSIS — R739 Hyperglycemia, unspecified: Secondary | ICD-10-CM

## 2019-01-18 DIAGNOSIS — Z0000 Encounter for general adult medical examination without abnormal findings: Secondary | ICD-10-CM | POA: Diagnosis not present

## 2019-01-18 LAB — COMPREHENSIVE METABOLIC PANEL
ALT: 13 U/L (ref 0–53)
AST: 16 U/L (ref 0–37)
Albumin: 4.6 g/dL (ref 3.5–5.2)
Alkaline Phosphatase: 90 U/L (ref 39–117)
BUN: 19 mg/dL (ref 6–23)
CO2: 28 mEq/L (ref 19–32)
Calcium: 9.1 mg/dL (ref 8.4–10.5)
Chloride: 103 mEq/L (ref 96–112)
Creatinine, Ser: 0.83 mg/dL (ref 0.40–1.50)
GFR: 94.56 mL/min (ref >60.00)
Glucose, Bld: 91 mg/dL (ref 70–99)
Potassium: 4.4 mEq/L (ref 3.5–5.1)
Sodium: 140 mEq/L (ref 135–145)
Total Bilirubin: 0.7 mg/dL (ref 0.2–1.2)
Total Protein: 7.1 g/dL (ref 6.0–8.3)

## 2019-01-18 LAB — CBC WITH DIFFERENTIAL/PLATELET
Basophils Absolute: 0 10*3/uL (ref 0.0–0.1)
Basophils Relative: 0.2 % (ref 0.0–3.0)
Eosinophils Absolute: 0 10*3/uL (ref 0.0–0.7)
Eosinophils Relative: 0.5 % (ref 0.0–5.0)
HCT: 40.9 % (ref 39.0–52.0)
Hemoglobin: 13.8 g/dL (ref 13.0–17.0)
Lymphocytes Relative: 20.4 % (ref 12.0–46.0)
Lymphs Abs: 1.6 10*3/uL (ref 0.7–4.0)
MCHC: 33.8 g/dL (ref 30.0–36.0)
MCV: 91.3 fl (ref 78.0–100.0)
Monocytes Absolute: 0.6 10*3/uL (ref 0.1–1.0)
Monocytes Relative: 7.3 % (ref 3.0–12.0)
Neutro Abs: 5.6 10*3/uL (ref 1.4–7.7)
Neutrophils Relative %: 71.6 % (ref 43.0–77.0)
Platelets: 248 10*3/uL (ref 150.0–400.0)
RBC: 4.48 Mil/uL (ref 4.22–5.81)
RDW: 14.9 % (ref 11.5–15.5)
WBC: 7.8 10*3/uL (ref 4.0–10.5)

## 2019-01-18 LAB — PSA: PSA: 0.56 ng/mL (ref 0.10–4.00)

## 2019-01-18 LAB — LIPID PANEL
Cholesterol: 211 mg/dL — ABNORMAL HIGH (ref 0–200)
HDL: 63.1 mg/dL (ref >39.00)
NonHDL: 148.29
Total CHOL/HDL Ratio: 3
Triglycerides: 313 mg/dL — ABNORMAL HIGH (ref 0.0–149.0)
VLDL: 62.6 mg/dL — ABNORMAL HIGH (ref 0.0–40.0)

## 2019-01-18 LAB — LDL CHOLESTEROL, DIRECT: Direct LDL: 106 mg/dL

## 2019-01-18 LAB — HEMOGLOBIN A1C: Hgb A1c MFr Bld: 5.7 % (ref 4.6–6.5)

## 2019-01-18 NOTE — Progress Notes (Signed)
Subjective:    Patient ID: Joseph Sexton, male    DOB: [birth year], 58 y.o.   MRN: [id number]  DOS:  01/18/2019 Type of visit - description: CPX In general feeling well.  Has no major concerns.   Wt Readings from Last 3 Encounters:  01/18/19 137 lb 6 oz (62.3 kg)  06/16/17 135 lb 2 oz (61.3 kg)  06/13/16 138 lb 2 oz (62.7 kg)    Review of Systems  A 14 point review of systems is negative    Past Medical History:  Diagnosis Date  . Allergy    seasonal  . Anemia   . H/O abnormal weight loss   . Hyperglycemia 05/26/2014  . Inguinal hernia, bilateral   . Thrombocytopenia ([facility])    Per chart review    Past Surgical History:  Procedure Laterality Date  . EYE SURGERY  1985   unsure of exact surgery  . HERNIA REPAIR Bilateral 2011    Social History   Socioeconomic History  . Marital status: Married    Spouse name: Not on file  . Number of children: 3  . Years of education: Not on file  . Highest education level: Not on file  Occupational History  . Occupation: works at [employer]  . Occupation: used to work @ [employer]  . Financial resource strain: Not on file  . Food insecurity    Worry: Not on file    Inability: Not on file  . Transportation needs    Medical: Not on file    Non-medical: Not on file  Tobacco Use  . Smoking status: Never Smoker  . Smokeless tobacco: Never Used  Substance and Sexual Activity  . Alcohol use: Yes    Alcohol/week: 0.0 standard drinks    Comment: Social Drinker  . Drug use: No  . Sexual activity: Not on file  Lifestyle  . Physical activity    Days per week: Not on file    Minutes per session: Not on file  . Stress: Not on file  Relationships  . Social Herbalist on phone: Not on file    Gets together: Not on file    Attends religious service: Not on file    Active member of club or organization: Not on file    Attends meetings of clubs or organizations: Not on file    Relationship status: Not  on file  . Intimate partner violence    Fear of current or ex partner: Not on file    Emotionally abused: Not on file    Physically abused: Not on file    Forced sexual activity: Not on file  Other Topics Concern  . Not on file  Social History Narrative   Household: pt, wife, 2 children, 1 g-child   Born in [place] in [place] since [birth year]   2 daughters, one son     Family History  Problem Relation Age of Onset  . Hypertension Mother   . Heart disease Father 57  . Stroke Father 51  . Hypertension Sister   . Diabetes Neg Hx   . Colon cancer Neg Hx   . Prostate cancer Neg Hx      Allergies as of 01/18/2019      Reactions   Pollen Extract       Medication List       Accurate as of January 18, 2019 11:59 PM. If you have any questions, ask your nurse or  doctor.        STOP taking these medications   B-12 1000 MCG Tbcr Stopped by: Willow Ora, MD   JOINT FORMULA PO Stopped by: Willow Ora, MD   multivitamin with minerals tablet Stopped by: Willow Ora, MD     TAKE these medications   cholecalciferol 1000 units tablet Commonly known as: VITAMIN D Take 1,000 Units by mouth daily.   Fish Oil 1200 MG Caps Take by mouth.   vitamin C 1000 MG tablet Take 1,000 mg by mouth daily.           Objective:   Physical Exam BP 122/75 (BP Location: Left Arm, Patient Position: Sitting, Cuff Size: Small)   Pulse 87   Temp 98.2 F (36.8 C) (Temporal)   Resp 16   Ht 5\' 4"  (1.626 m)   Wt 137 lb 6 oz (62.3 kg)   SpO2 99%   BMI 23.58 kg/m  General: Well developed, NAD, BMI noted Neck: No  thyromegaly  HEENT:  Normocephalic . Face symmetric, atraumatic Lungs:  CTA B Normal respiratory effort, no intercostal retractions, no accessory muscle use. Heart: RRR,  no murmur.  No pretibial edema bilaterally  Abdomen:  Not distended, soft, non-tender. No rebound or rigidity.   Skin: Exposed areas without rash. Not pale. Not jaundice DRE: Normal sphincter tone, brown  stools, normal prostate. Neurologic:  alert & oriented X3.  Speech normal, gait appropriate for age and unassisted Strength symmetric and appropriate for age.  Psych: Cognition and judgment appear intact.  Cooperative with normal attention span and concentration.  Behavior appropriate. No anxious or depressed appearing.     Assessment     Assessment Prediabetes A1c 6.0 05-2012 Allergic rhinitis H/o thrombocytopenia Palpable aorta (11-13-1999 abdomen 2010, normal aorta)  PLAN: Here for CPX Prediabetes: Doing well with diet and exercise, check A1c Hyperkalemia: Last year, potassium was elevated, he stopped some OTC supplements rich in potassium.  Rechecking today. EKG today: NSR RTC 1 year

## 2019-01-18 NOTE — Patient Instructions (Signed)
GO TO THE LAB : Get the blood work     GO TO THE FRONT DESK Schedule your next appointment   in 1 year

## 2019-01-18 NOTE — Progress Notes (Signed)
Pre visit review using our clinic review tool, if applicable. No additional management support is needed unless otherwise documented below in the visit note. 

## 2019-01-18 NOTE — Assessment & Plan Note (Signed)
-  Td: 06-9353 -  pnm 23: 2017 - flu shot today --CCS: colonoscopy ~ 09-2010, Dr. Alice Reichert  repeat in 10 years --Prostate cancer screening DRE normal, check a PSA --Diet and exercise discussed --Labs : CMP, FLP, CBC, A1c, PSA

## 2019-01-19 NOTE — Assessment & Plan Note (Signed)
Here for CPX Prediabetes: Doing well with diet and exercise, check A1c Hyperkalemia: Last year, potassium was elevated, he stopped some OTC supplements rich in potassium.  Rechecking today. EKG today: NSR RTC 1 year

## 2020-03-05 ENCOUNTER — Ambulatory Visit (HOSPITAL_BASED_OUTPATIENT_CLINIC_OR_DEPARTMENT_OTHER)
Admission: RE | Admit: 2020-03-05 | Discharge: 2020-03-05 | Disposition: A | Discharge status: Home / Self Care | Payer: 59 | Source: Ambulatory Visit | Attending: Internal Medicine | Admitting: Internal Medicine

## 2020-03-05 ENCOUNTER — Ambulatory Visit (INDEPENDENT_AMBULATORY_CARE_PROVIDER_SITE_OTHER): Payer: 59 | Admitting: Internal Medicine

## 2020-03-05 ENCOUNTER — Encounter: Payer: Self-pay | Admitting: Internal Medicine

## 2020-03-05 ENCOUNTER — Other Ambulatory Visit: Payer: Self-pay

## 2020-03-05 VITALS — BP 152/80 | HR 69 | Temp 98.1°F | Ht 64.0 in | Wt 125.0 lb

## 2020-03-05 DIAGNOSIS — R739 Hyperglycemia, unspecified: Secondary | ICD-10-CM

## 2020-03-05 DIAGNOSIS — R634 Abnormal weight loss: Secondary | ICD-10-CM | POA: Insufficient documentation

## 2020-03-05 DIAGNOSIS — Z23 Encounter for immunization: Secondary | ICD-10-CM | POA: Diagnosis not present

## 2020-03-05 DIAGNOSIS — Z0001 Encounter for general adult medical examination with abnormal findings: Secondary | ICD-10-CM

## 2020-03-05 DIAGNOSIS — Z0000 Encounter for general adult medical examination without abnormal findings: Secondary | ICD-10-CM

## 2020-03-05 LAB — CBC WITH DIFFERENTIAL/PLATELET
Basophils Absolute: 0 10*3/uL (ref 0.0–0.1)
Basophils Relative: 0.2 % (ref 0.0–3.0)
Eosinophils Absolute: 0 10*3/uL (ref 0.0–0.7)
Eosinophils Relative: 0.5 % (ref 0.0–5.0)
HCT: 39.7 % (ref 39.0–52.0)
Hemoglobin: 13.3 g/dL (ref 13.0–17.0)
Lymphocytes Relative: 28.8 % (ref 12.0–46.0)
Lymphs Abs: 1.3 10*3/uL (ref 0.7–4.0)
MCHC: 33.4 g/dL (ref 30.0–36.0)
MCV: 90.6 fl (ref 78.0–100.0)
Monocytes Absolute: 0.3 10*3/uL (ref 0.1–1.0)
Monocytes Relative: 7 % (ref 3.0–12.0)
Neutro Abs: 2.9 10*3/uL (ref 1.4–7.7)
Neutrophils Relative %: 63.5 % (ref 43.0–77.0)
Platelets: 242 10*3/uL (ref 150.0–400.0)
RBC: 4.38 Mil/uL (ref 4.22–5.81)
RDW: 15 % (ref 11.5–15.5)
WBC: 4.6 10*3/uL (ref 4.0–10.5)

## 2020-03-05 LAB — COMPREHENSIVE METABOLIC PANEL
ALT: 14 U/L (ref 0–53)
AST: 17 U/L (ref 0–37)
Albumin: 4.2 g/dL (ref 3.5–5.2)
Alkaline Phosphatase: 87 U/L (ref 39–117)
BUN: 22 mg/dL (ref 6–23)
CO2: 31 mEq/L (ref 19–32)
Calcium: 8.7 mg/dL (ref 8.4–10.5)
Chloride: 103 mEq/L (ref 96–112)
Creatinine, Ser: 0.75 mg/dL (ref 0.40–1.50)
GFR: 97.78 mL/min (ref >60.00)
Glucose, Bld: 97 mg/dL (ref 70–99)
Potassium: 4.1 mEq/L (ref 3.5–5.1)
Sodium: 139 mEq/L (ref 135–145)
Total Bilirubin: 1.2 mg/dL (ref 0.2–1.2)
Total Protein: 6.7 g/dL (ref 6.0–8.3)

## 2020-03-05 LAB — LIPID PANEL
Cholesterol: 162 mg/dL (ref 0–200)
HDL: 71.8 mg/dL (ref >39.00)
LDL Cholesterol: 75 mg/dL (ref 0–99)
NonHDL: 90.11
Total CHOL/HDL Ratio: 2
Triglycerides: 75 mg/dL (ref 0.0–149.0)
VLDL: 15 mg/dL (ref 0.0–40.0)

## 2020-03-05 LAB — TSH: TSH: 2.37 u[IU]/mL (ref 0.35–4.50)

## 2020-03-05 LAB — HEMOGLOBIN A1C: Hgb A1c MFr Bld: 5.7 % (ref 4.6–6.5)

## 2020-03-05 NOTE — Patient Instructions (Signed)
Check the  blood pressure monthly. BP GOAL is between 110/65 and  135/85. If it is consistently higher or lower, let me know   GO TO THE LAB : Get the blood work     GO TO THE FRONT DESK, PLEASE SCHEDULE YOUR APPOINTMENTS Come back for   for a checkup in 6 months   STOP BY THE FIRST FLOOR:  get the XR

## 2020-03-05 NOTE — Progress Notes (Signed)
Subjective:    Patient ID: Joseph Sexton, male    DOB: May 31, 1960, 59 y.o.   MRN: [id number]  DOS:  03/05/2020 Type of visit - description: CPX In general feels well.  I did notice weight loss, he said that wt loss started few months ago.  Specifically denies fever or night sweats.  No cough or chest pain, no GI or GU symptoms. He has been very active, since the last time he changed jobs and now has a full-time job plus a part-time job. Admits to some stress but no anxiety. Occasional runny nose with eating.  Symptoms are mild.  Also reports R>>L numbness at the thumb second and third fingers with no motor deficits or neck pain.  Wt Readings from Last 3 Encounters:  03/05/20 125 lb (56.7 kg)  01/18/19 137 lb 6 oz (62.3 kg)  06/16/17 135 lb 2 oz (61.3 kg)     Review of Systems  Other than above, a 14 point review of systems is negative      Past Medical History:  Diagnosis Date  . Allergy    seasonal  . Anemia   . H/O abnormal weight loss   . Hyperglycemia 05/26/2014  . Inguinal hernia, bilateral   . Thrombocytopenia ([facility])    Per chart review    Past Surgical History:  Procedure Laterality Date  . EYE SURGERY  1985   unsure of exact surgery  . HERNIA REPAIR Bilateral 2011    Allergies as of 03/05/2020      Reactions   Pollen Extract       Medication List       Accurate as of March 05, 2020 12:02 PM. If you have any questions, ask your nurse or doctor.        cholecalciferol 1000 units tablet Commonly known as: VITAMIN D Take 1,000 Units by mouth daily.   Fish Oil 1200 MG Caps Take by mouth.   vitamin C 1000 MG tablet Take 1,000 mg by mouth daily.          Objective:   Physical Exam BP (!) 150/75 (BP Location: Right Arm, Patient Position: Sitting, Cuff Size: Large)   Pulse 69   Temp 98.1 F (36.7 C) (Oral)   Ht 5\' 4"  (1.626 m)   Wt 125 lb (56.7 kg)   SpO2 100%   BMI 21.46 kg/m  General: Well developed, NAD, BMI noted Neck: No   thyromegaly.  No mass at the supraclavicular areas. Lymphatic system: No lymphadenopathies at the neck or groins HEENT:  Normocephalic . Face symmetric, atraumatic Lungs:  CTA B Normal respiratory effort, no intercostal retractions, no accessory muscle use. Heart: RRR,  no murmur.  Abdomen:  Not distended, soft, non-tender. No rebound or rigidity.   Lower extremities: no pretibial edema bilaterally  Skin: Exposed areas without rash. Not pale. Not jaundice Neurologic:  alert & oriented X3.  Speech normal, gait appropriate for age and unassisted Strength symmetric and appropriate for age.  Psych: Cognition and judgment appear intact.  Cooperative with normal attention span and concentration.  Behavior appropriate. No anxious or depressed appearing.     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 Allergic rhinitis: Has occasional nose congestion after eating, not bothersome, will call if needs treatment Weight loss: ROS benign, he did lost his job and  now works a full-time and a part-time job which could account for some of the wt loss.  He also mentioned that 125 pound used to be his normal wt years before. In addition to routine labs and will check a chest x-ray. CTS: Hand paresthesias, suspect CTS, recommend use splinters at night.  If not better will let me know RTC 6 months      This visit occurred during the SARS-CoV-2 public health emergency.  Safety protocols were in place, including screening questions prior to the visit, additional usage of staff PPE, and extensive cleaning of exam room while observing appropriate contact time as indicated for disinfecting solutions.

## 2020-03-08 ENCOUNTER — Encounter: Payer: Self-pay | Admitting: Internal Medicine

## 2020-03-08 NOTE — Assessment & Plan Note (Signed)
-  Td: 05-7288 -  pnm 23: 2017 -COVID vaccines x3 - flu shot today --CCS: colonoscopy ~ 09-2010, Dr. Norma Fredrickson  repeat in 10 years --Prostate cancer screening DRE  and PSA normal last year. --Diet unchanged from previous years.  He has been more active, working 1.5 jobs.    --Labs: CMP, FLP, CBC,A1c, TSH

## 2020-03-08 NOTE — Assessment & Plan Note (Signed)
Here for CPX Prediabetes: Check A1c Elevated BP: BP in the 150s, recommend to check at home. Allergic rhinitis: Has occasional nose congestion after eating, not bothersome, will call if needs treatment Weight loss: ROS benign, he did lost his job and  now works a full-time and a part-time job which could account for some of the wt loss.  He also mentioned that 125 pound used to be his normal wt years before. In addition to routine labs and will check a chest x-ray. CTS: Hand paresthesias, suspect CTS, recommend use splinters at night.  If not better will let me know RTC 6 months

## 2020-08-31 ENCOUNTER — Other Ambulatory Visit: Payer: Self-pay

## 2020-08-31 ENCOUNTER — Encounter: Payer: Self-pay | Admitting: Internal Medicine

## 2020-08-31 ENCOUNTER — Ambulatory Visit (INDEPENDENT_AMBULATORY_CARE_PROVIDER_SITE_OTHER): Payer: 59 | Admitting: Internal Medicine

## 2020-08-31 VITALS — BP 124/82 | HR 55 | Temp 98.2°F | Resp 16 | Ht 64.0 in | Wt 124.4 lb

## 2020-08-31 DIAGNOSIS — R634 Abnormal weight loss: Secondary | ICD-10-CM

## 2020-08-31 DIAGNOSIS — L84 Corns and callosities: Secondary | ICD-10-CM

## 2020-08-31 NOTE — Progress Notes (Signed)
   Subjective:    Patient ID: Joseph Sexton, male    DOB: [birth year], 60 y.o.   MRN: [id number]  DOS:  08/31/2020 Type of visit - description: F/U  Follow-up Feels well. He remains active. Has no concerns. With talk about his weight.  Also known history of a callus on the left foot, it hurts whenever he stands for a while Wt Readings from Last 3 Encounters:  08/31/20 124 lb 6 oz (56.4 kg)  03/05/20 125 lb (56.7 kg)  01/18/19 137 lb 6 oz (62.3 kg)    Review of Systems See above   Past Medical History:  Diagnosis Date   Allergy    seasonal   Anemia    H/O abnormal weight loss    Hyperglycemia 05/26/2014   Inguinal hernia, bilateral    Thrombocytopenia ([facility])    Per chart review    Past Surgical History:  Procedure Laterality Date   EYE SURGERY  1985   unsure of exact surgery   HERNIA REPAIR Bilateral 2011    Allergies as of 08/31/2020       Reactions   Pollen Extract         Medication List        Accurate as of August 31, 2020 11:59 PM. If you have any questions, ask your nurse or doctor.          cholecalciferol 1000 units tablet Commonly known as: VITAMIN D Take 1,000 Units by mouth daily.   Fish Oil 1200 MG Caps Take by mouth.   vitamin C 1000 MG tablet Take 1,000 mg by mouth daily.           Objective:   Physical Exam Musculoskeletal:       Feet:   BP 124/82 (BP Location: Left Arm, Patient Position: Sitting, Cuff Size: Small)   Pulse (!) 55   Temp 98.2 F (36.8 C) (Oral)   Resp 16   Ht 5\' 4"  (1.626 m)   Wt 124 lb 6 oz (56.4 kg)   SpO2 98%   BMI 21.35 kg/m  General:   Well developed, NAD, BMI noted. HEENT:  Normocephalic . Face symmetric, atraumatic Skin: Not pale. Not jaundice Neurologic:  alert & oriented X3.  Speech normal, gait appropriate for age and unassisted Psych--  Cognition and judgment appear intact.  Cooperative with normal attention span and concentration.  Behavior appropriate. No anxious or depressed  appearing.      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 Callus: new issue , @ the left great toe, offered podiatry referral, states he will call when ready. COVID-vaccine #4 recommended. RTC 02/2021 CPX     This visit occurred during the SARS-CoV-2 public health emergency.  Safety protocols were in place, including screening questions prior to the visit, additional usage of staff PPE, and extensive cleaning of exam room while observing appropriate contact time as indicated for disinfecting solutions.

## 2020-08-31 NOTE — Patient Instructions (Signed)
    GO TO THE FRONT DESK, PLEASE SCHEDULE YOUR APPOINTMENTS Come back for  a physical exam in 6 months    [facility] Address: [address] [place], [place], [place] [address] Phone: [phone]

## 2020-09-01 NOTE — Assessment & Plan Note (Signed)
Weight loss: Weight is a stable, this is his normal weight, when he was 140 pounds, he was actually overweight. Callus: new issue , @ the left great toe, offered podiatry referral, states he will call when ready. COVID-vaccine #4 recommended. RTC 02/2021 CPX

## 2021-03-02 ENCOUNTER — Encounter: Payer: Self-pay | Admitting: Internal Medicine

## 2021-03-02 ENCOUNTER — Ambulatory Visit (INDEPENDENT_AMBULATORY_CARE_PROVIDER_SITE_OTHER): Payer: 59 | Admitting: Internal Medicine

## 2021-03-02 VITALS — BP 126/74 | HR 63 | Temp 97.9°F | Resp 16 | Ht 64.0 in | Wt 126.5 lb

## 2021-03-02 DIAGNOSIS — Z0000 Encounter for general adult medical examination without abnormal findings: Secondary | ICD-10-CM | POA: Diagnosis not present

## 2021-03-02 DIAGNOSIS — Z1211 Encounter for screening for malignant neoplasm of colon: Secondary | ICD-10-CM

## 2021-03-02 DIAGNOSIS — Z23 Encounter for immunization: Secondary | ICD-10-CM

## 2021-03-02 DIAGNOSIS — R739 Hyperglycemia, unspecified: Secondary | ICD-10-CM | POA: Diagnosis not present

## 2021-03-02 LAB — CBC WITH DIFFERENTIAL/PLATELET
Basophils Absolute: 0 10*3/uL (ref 0.0–0.1)
Basophils Relative: 0.4 % (ref 0.0–3.0)
Eosinophils Absolute: 0 10*3/uL (ref 0.0–0.7)
Eosinophils Relative: 1.1 % (ref 0.0–5.0)
HCT: 38.2 % — ABNORMAL LOW (ref 39.0–52.0)
Hemoglobin: 12.8 g/dL — ABNORMAL LOW (ref 13.0–17.0)
Lymphocytes Relative: 30.6 % (ref 12.0–46.0)
Lymphs Abs: 1.3 10*3/uL (ref 0.7–4.0)
MCHC: 33.5 g/dL (ref 30.0–36.0)
MCV: 92.5 fl (ref 78.0–100.0)
Monocytes Absolute: 0.3 10*3/uL (ref 0.1–1.0)
Monocytes Relative: 7.5 % (ref 3.0–12.0)
Neutro Abs: 2.5 10*3/uL (ref 1.4–7.7)
Neutrophils Relative %: 60.4 % (ref 43.0–77.0)
Platelets: 249 10*3/uL (ref 150.0–400.0)
RBC: 4.14 Mil/uL — ABNORMAL LOW (ref 4.22–5.81)
RDW: 15.3 % (ref 11.5–15.5)
WBC: 4.2 10*3/uL (ref 4.0–10.5)

## 2021-03-02 LAB — COMPREHENSIVE METABOLIC PANEL
ALT: 17 U/L (ref 0–53)
AST: 18 U/L (ref 0–37)
Albumin: 4 g/dL (ref 3.5–5.2)
Alkaline Phosphatase: 75 U/L (ref 39–117)
BUN: 20 mg/dL (ref 6–23)
CO2: 30 mEq/L (ref 19–32)
Calcium: 8.8 mg/dL (ref 8.4–10.5)
Chloride: 105 mEq/L (ref 96–112)
Creatinine, Ser: 0.72 mg/dL (ref 0.40–1.50)
GFR: 98.3 mL/min (ref >60.00)
Glucose, Bld: 98 mg/dL (ref 70–99)
Potassium: 4.1 mEq/L (ref 3.5–5.1)
Sodium: 140 mEq/L (ref 135–145)
Total Bilirubin: 1.1 mg/dL (ref 0.2–1.2)
Total Protein: 6.5 g/dL (ref 6.0–8.3)

## 2021-03-02 LAB — LIPID PANEL
Cholesterol: 181 mg/dL (ref 0–200)
HDL: 71.2 mg/dL (ref >39.00)
LDL Cholesterol: 86 mg/dL (ref 0–99)
NonHDL: 109.64
Total CHOL/HDL Ratio: 3
Triglycerides: 117 mg/dL (ref 0.0–149.0)
VLDL: 23.4 mg/dL (ref 0.0–40.0)

## 2021-03-02 LAB — HEMOGLOBIN A1C: Hgb A1c MFr Bld: 5.9 % (ref 4.6–6.5)

## 2021-03-02 LAB — TSH: TSH: 3.11 u[IU]/mL (ref 0.35–5.50)

## 2021-03-02 NOTE — Assessment & Plan Note (Signed)
Here for CPX Weight loss: Weight has stabilized Prediabetes: Checking A1c. RTC 1 year

## 2021-03-02 NOTE — Assessment & Plan Note (Signed)
-  Td: 05-8451 -  pnm 23: 2017 - Shingrix #1 today, next 2-3 months -COVID vac: UTD - flu shot today --CCS: colonoscopy ~ 09-2010, Dr. Norma Fredrickson  repeat in 10 years.  Referral sent --Prostate cancer screening: No symptoms, DRE normal, checking a PSA --Diet, exercise: Very active at work, encouraged healthy diet --Labs: CMP, FLP, CBC, A1c PSA -Advance care planning information in Spanish provided

## 2021-03-02 NOTE — Patient Instructions (Addendum)
Return in 2 months for the shingrix 2nd dose    GO TO THE LAB : Get the blood work     GO TO THE FRONT DESK, PLEASE SCHEDULE YOUR APPOINTMENTS Come back for   a physical exam in 1 year

## 2021-03-02 NOTE — Progress Notes (Signed)
Subjective:    Patient ID: Joseph Sexton, male    DOB: [birth year], 60 y.o.   MRN: [id number]  DOS:  03/02/2021 Type of visit - description: CPX  Since the last visit is doing well and actually has no concerns.  Wt Readings from Last 3 Encounters:  03/02/21 126 lb 8 oz (57.4 kg)  08/31/20 124 lb 6 oz (56.4 kg)  03/05/20 125 lb (56.7 kg)     Review of Systems   A 14 point review of systems is negative    Past Medical History:  Diagnosis Date   Allergy    seasonal   Anemia    H/O abnormal weight loss    Hyperglycemia 05/26/2014   Inguinal hernia, bilateral    Thrombocytopenia ([facility])    Per chart review    Past Surgical History:  Procedure Laterality Date   EYE SURGERY  1985   unsure of exact surgery   HERNIA REPAIR Bilateral 2011   Social History   Socioeconomic History   Marital status: Married    Spouse name: Not on file   Number of children: 3   Years of education: Not on file   Highest education level: Not on file  Occupational History   Occupation: works at [employer]   Occupation: works at [employer]   Tobacco Use   Smoking status: Never   Smokeless tobacco: Never  Substance and Sexual Activity   Alcohol use: Yes    Alcohol/week: 0.0 standard drinks    Comment: Social Drinker   Drug use: No   Sexual activity: Not on file  Other Topics Concern   Not on file  Social History Narrative   Household: pt, wife, 2 children, 1 g-child   Born in [place] , in [place] since 1985   2 daughters, one son   Social Determinants of Health   Financial Resource Strain: Not on file  Food Insecurity: Not on file  Transportation Needs: Not on file  Physical Activity: Not on file  Stress: Not on file  Social Connections: Not on file  Intimate Partner Violence: Not on file    Allergies as of 03/02/2021       Reactions   Pollen Extract         Medication List        Accurate as of March 02, 2021  6:34 PM. If you have any questions, ask your  nurse or doctor.          cholecalciferol 1000 units tablet Commonly known as: VITAMIN D Take 1,000 Units by mouth daily.   Fish Oil 1200 MG Caps Take by mouth.   vitamin C 1000 MG tablet Take 1,000 mg by mouth daily.           Objective:   Physical Exam BP 126/74 (BP Location: Left Arm, Patient Position: Sitting, Cuff Size: Small)    Pulse 63    Temp 97.9 F (36.6 C) (Oral)    Resp 16    Ht 5\' 4"  (1.626 m)    Wt 126 lb 8 oz (57.4 kg)    SpO2 97%    BMI 21.71 kg/m  General: Well developed, NAD, BMI noted Neck: No  thyromegaly  HEENT:  Normocephalic . Face symmetric, atraumatic Lungs:  CTA B Normal respiratory effort, no intercostal retractions, no accessory muscle use. Heart: RRR,  no murmur.  Abdomen:  Not distended, soft, non-tender. No rebound or rigidity.   Lower extremities: no pretibial edema bilaterally DRE: Normal sphincter tone,  normal prostate, stools brown. Skin: Exposed areas without rash. Not pale. Not jaundice Neurologic:  alert & oriented X3.  Speech normal, gait appropriate for age and unassisted Strength symmetric and appropriate for age.  Psych: Cognition and judgment appear intact.  Cooperative with normal attention span and concentration.  Behavior appropriate. No anxious or depressed appearing.     Assessment     Assessment Prediabetes A1c 6.0 05-2012 Allergic rhinitis H/o thrombocytopenia Palpable aorta (US abdomen 2010, normal aorta)  PLAN: Here for CPX Weight loss: Weight has stabilized Prediabetes: Checking A1c. RTC 1 year   This visit occurred during the SARS-CoV-2 public health emergency.  Safety protocols were in place, including screening questions prior to the visit, additional usage of staff PPE, and extensive cleaning of exam room while observing appropriate contact time as indicated for disinfecting solutions.

## 2021-03-09 ENCOUNTER — Encounter: Payer: Self-pay | Admitting: Internal Medicine

## 2021-03-16 NOTE — Progress Notes (Signed)
Order(s) created erroneously. Erroneous order ID: [id number] ° Order moved by: SHEALY, DEBRA D ° Order move date/time: 03/16/2021 1:30 PM ° Source Patient: [id number] ° Source Contact: 05/23/2014 ° Destination Patient: [id number] ° Destination Contact: 10/31/2019 °

## 2021-03-16 NOTE — Progress Notes (Signed)
Order(s) created erroneously. Erroneous order ID: [id number] ° Order moved by: SHEALY, DEBRA D ° Order move date/time: 03/16/2021 1:27 PM ° Source Patient: [id number] ° Source Contact: 05/22/2014 ° Destination Patient: [id number] ° Destination Contact: 10/31/2019 °

## 2021-05-04 ENCOUNTER — Ambulatory Visit (INDEPENDENT_AMBULATORY_CARE_PROVIDER_SITE_OTHER): Payer: 59 | Admitting: *Deleted

## 2021-05-04 DIAGNOSIS — Z23 Encounter for immunization: Secondary | ICD-10-CM | POA: Diagnosis not present

## 2021-05-04 NOTE — Progress Notes (Signed)
Joseph Sexton is a 61 y.o. male presents to the office today for a shingles vaccine. This is his second and final shingles vaccine.  Vaccine given in left deltoid. Patient tolerated injection well.   Kathrynn Speed , CMA

## 2021-07-22 IMAGING — DX DG CHEST 2V
2 series · 2 of 2 positions shown · non-contrast
Comparison: None.

CLINICAL DATA: Unintentional weight loss.

EXAM:
CHEST - 2 VIEW

[chest pa]
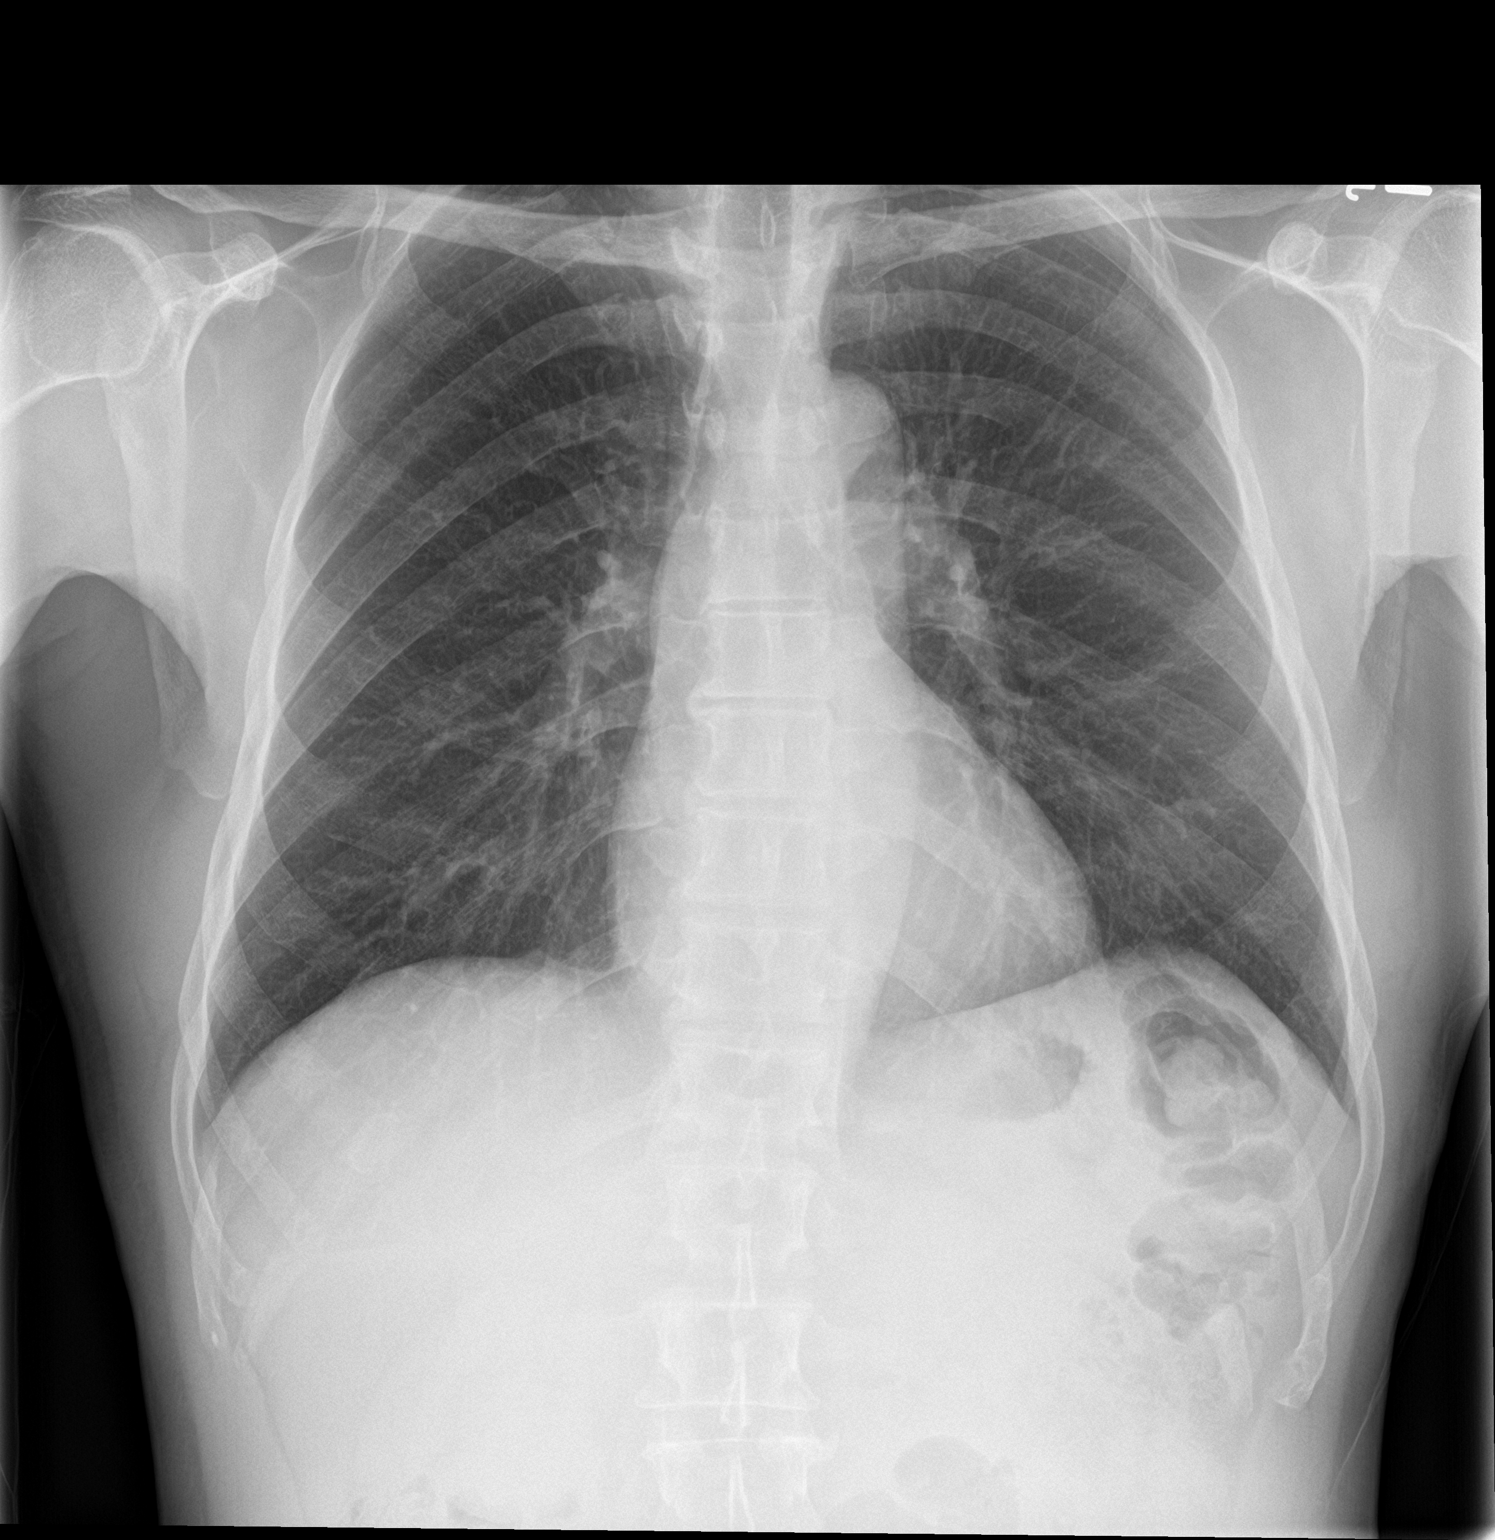

[chest lat]
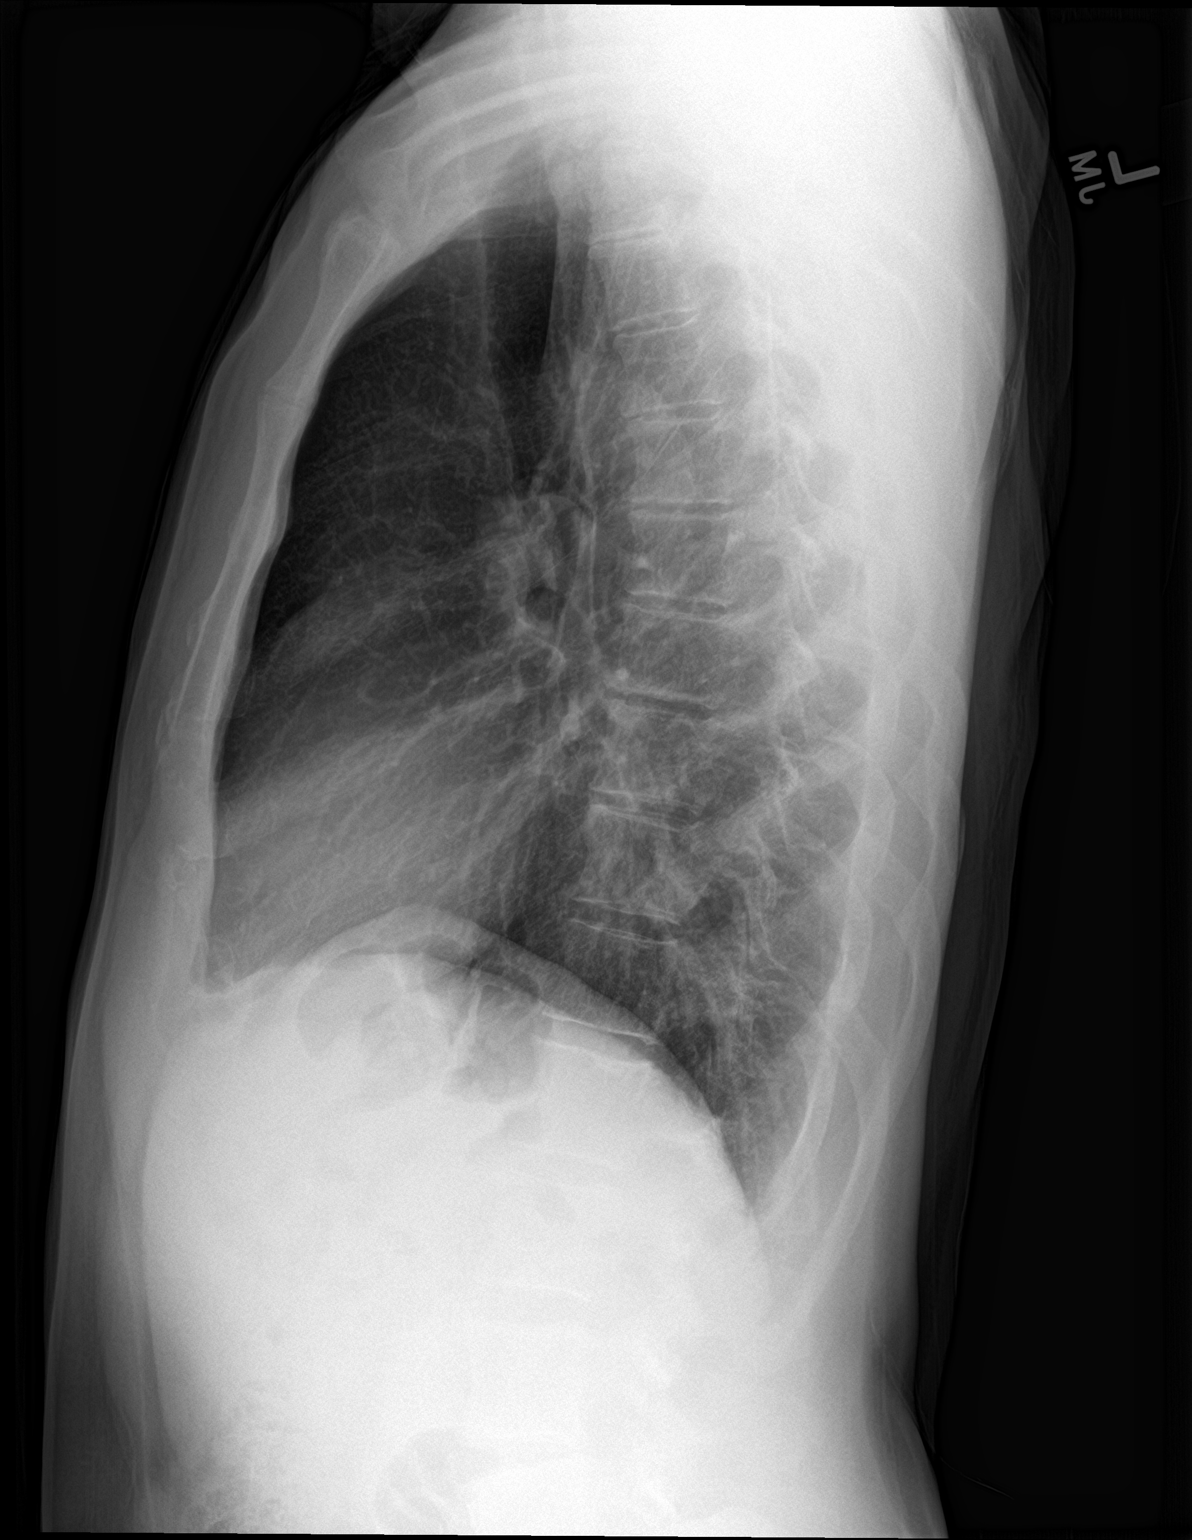

[2 of 2 positions shown; findings below may reference images not displayed]

FINDINGS: The heart size and mediastinal contours are within normal limits.
Both lungs are clear. The visualized skeletal structures are
unremarkable.
IMPRESSION: No active cardiopulmonary disease.

## 2021-12-06 LAB — HM COLONOSCOPY

## 2022-02-08 ENCOUNTER — Encounter: Payer: Self-pay | Admitting: Internal Medicine

## 2022-03-08 ENCOUNTER — Ambulatory Visit (INDEPENDENT_AMBULATORY_CARE_PROVIDER_SITE_OTHER): Payer: 59 | Admitting: Internal Medicine

## 2022-03-08 ENCOUNTER — Encounter: Payer: Self-pay | Admitting: Internal Medicine

## 2022-03-08 VITALS — BP 132/76 | HR 67 | Temp 97.7°F | Resp 18 | Ht 64.0 in | Wt 123.8 lb

## 2022-03-08 DIAGNOSIS — M25511 Pain in right shoulder: Secondary | ICD-10-CM

## 2022-03-08 DIAGNOSIS — Z23 Encounter for immunization: Secondary | ICD-10-CM

## 2022-03-08 DIAGNOSIS — Z0000 Encounter for general adult medical examination without abnormal findings: Secondary | ICD-10-CM | POA: Diagnosis not present

## 2022-03-08 DIAGNOSIS — R739 Hyperglycemia, unspecified: Secondary | ICD-10-CM

## 2022-03-08 DIAGNOSIS — M25512 Pain in left shoulder: Secondary | ICD-10-CM

## 2022-03-08 DIAGNOSIS — Z125 Encounter for screening for malignant neoplasm of prostate: Secondary | ICD-10-CM | POA: Diagnosis not present

## 2022-03-08 LAB — CBC WITH DIFFERENTIAL/PLATELET
Basophils Absolute: 0 10*3/uL (ref 0.0–0.1)
Basophils Relative: 0.4 % (ref 0.0–3.0)
Eosinophils Absolute: 0.1 10*3/uL (ref 0.0–0.7)
Eosinophils Relative: 1.2 % (ref 0.0–5.0)
HCT: 38.8 % — ABNORMAL LOW (ref 39.0–52.0)
Hemoglobin: 12.9 g/dL — ABNORMAL LOW (ref 13.0–17.0)
Lymphocytes Relative: 32.8 % (ref 12.0–46.0)
Lymphs Abs: 1.8 10*3/uL (ref 0.7–4.0)
MCHC: 33.1 g/dL (ref 30.0–36.0)
MCV: 93.3 fl (ref 78.0–100.0)
Monocytes Absolute: 0.3 10*3/uL (ref 0.1–1.0)
Monocytes Relative: 6.3 % (ref 3.0–12.0)
Neutro Abs: 3.2 10*3/uL (ref 1.4–7.7)
Neutrophils Relative %: 59.3 % (ref 43.0–77.0)
Platelets: 272 10*3/uL (ref 150.0–400.0)
RBC: 4.16 Mil/uL — ABNORMAL LOW (ref 4.22–5.81)
RDW: 15.5 % (ref 11.5–15.5)
WBC: 5.3 10*3/uL (ref 4.0–10.5)

## 2022-03-08 LAB — COMPREHENSIVE METABOLIC PANEL
ALT: 16 U/L (ref 0–53)
AST: 17 U/L (ref 0–37)
Albumin: 4 g/dL (ref 3.5–5.2)
Alkaline Phosphatase: 69 U/L (ref 39–117)
BUN: 25 mg/dL — ABNORMAL HIGH (ref 6–23)
CO2: 28 mEq/L (ref 19–32)
Calcium: 8.7 mg/dL (ref 8.4–10.5)
Chloride: 104 mEq/L (ref 96–112)
Creatinine, Ser: 0.74 mg/dL (ref 0.40–1.50)
GFR: 96.8 mL/min (ref >60.00)
Glucose, Bld: 91 mg/dL (ref 70–99)
Potassium: 4.2 mEq/L (ref 3.5–5.1)
Sodium: 140 mEq/L (ref 135–145)
Total Bilirubin: 0.9 mg/dL (ref 0.2–1.2)
Total Protein: 6.5 g/dL (ref 6.0–8.3)

## 2022-03-08 LAB — LIPID PANEL
Cholesterol: 183 mg/dL (ref 0–200)
HDL: 76.4 mg/dL (ref >39.00)
LDL Cholesterol: 80 mg/dL (ref 0–99)
NonHDL: 106.55
Total CHOL/HDL Ratio: 2
Triglycerides: 135 mg/dL (ref 0.0–149.0)
VLDL: 27 mg/dL (ref 0.0–40.0)

## 2022-03-08 LAB — HEMOGLOBIN A1C: Hgb A1c MFr Bld: 5.9 % (ref 4.6–6.5)

## 2022-03-08 LAB — PSA: PSA: 0.44 ng/mL (ref 0.10–4.00)

## 2022-03-08 NOTE — Assessment & Plan Note (Signed)
Here for CPX Prediabetes: Checking labs.  Encouraged healthy lifestyle. Shoulder pain, worse on the left: Suspect impingement or rotator cuff injury.  Recommend Tylenol and refer to sports medicine. RTC 1 year

## 2022-03-08 NOTE — Assessment & Plan Note (Signed)
-  Td: 10-5275 -  pnm 23: 2017 - Shingrix x2 -COVID vac: recommended  - flu shot today --CCS: colonoscopy ~ 09-2010,  10 years. Colonoscopy 11-2021, no polyps, 10 years --Prostate cancer screening:  DRE normal 2022, no sxs, check psa  --Diet, exercise: Counseled. --Labs:  CBC CMP A1c FLP PSA

## 2022-03-08 NOTE — Progress Notes (Signed)
Subjective:    Patient ID: Joseph Sexton, male    DOB: 1961-01-23, 61 y.o.   MRN: [id number]  DOS:  03/08/2022 Type of visit - description: CPX  Here for CPX.  About 2 or 3 months ago, developed bilateral shoulder pain associated with some neck pain. Symptoms have improved, still has L>>R shoulder pain. The pain is worse when he gets his left arm down from being elevated. It also bothers him at night. Denies any neck injury. No motor deficits Pain is located at the posterior side of the shoulder. When asked, admits to some numbness in all fingers.  Wt Readings from Last 3 Encounters:  03/08/22 123 lb 12.8 oz (56.2 kg)  03/02/21 126 lb 8 oz (57.4 kg)  08/31/20 124 lb 6 oz (56.4 kg)     Review of Systems  Other than above, a 14 point review of systems is negative    Past Medical History:  Diagnosis Date   Allergy    seasonal   Anemia    H/O abnormal weight loss    Hyperglycemia 05/26/2014   Inguinal hernia, bilateral    Thrombocytopenia ([facility])    Per chart review    Past Surgical History:  Procedure Laterality Date   EYE SURGERY  1985   unsure of exact surgery   HERNIA REPAIR Bilateral 2011   Social History   Socioeconomic History   Marital status: Married    Spouse name: Not on file   Number of children: 3   Years of education: Not on file   Highest education level: Not on file  Occupational History   Occupation: works at [employer]   Occupation: works at [employer]   Tobacco Use   Smoking status: Never   Smokeless tobacco: Never  Substance and Sexual Activity   Alcohol use: Yes    Alcohol/week: 0.0 standard drinks of alcohol    Comment: Social Drinker   Drug use: No   Sexual activity: Not on file  Other Topics Concern   Not on file  Social History Narrative   Household: pt, wife, 2 children, 1 g-child   Born in [place] , in [place] since 1985   2 daughters, one son   Social Determinants of Corporate investment banker Strain: Not on file   Food Insecurity: Not on file  Transportation Needs: Not on file  Physical Activity: Not on file  Stress: Not on file  Social Connections: Not on file  Intimate Partner Violence: Not on file    Current Outpatient Medications  Medication Instructions   cholecalciferol (VITAMIN D) 1,000 Units, Oral, Daily   Omega-3 Fatty Acids (FISH OIL) 1200 MG CAPS Oral   vitamin C 1,000 mg, Oral, Daily       Objective:   Physical Exam BP 132/76 (BP Location: Left Arm, Patient Position: Sitting, Cuff Size: Normal)   Pulse 67   Temp 97.7 F (36.5 C) (Temporal)   Resp 18   Ht 5\' 4"  (1.626 m)   Wt 123 lb 12.8 oz (56.2 kg)   SpO2 98%   BMI 21.25 kg/m  General: Well developed, NAD, BMI noted Neck: No  thyromegaly  HEENT:  Normocephalic . Face symmetric, atraumatic Lungs:  CTA B Normal respiratory effort, no intercostal retractions, no accessory muscle use. Heart: RRR,  no murmur.  Abdomen:  Not distended, soft, non-tender. No rebound or rigidity.   Lower extremities: no pretibial edema bilaterally MSK: Shoulders symmetric, no TTP, range of motion normal. Pain elicited when  patient drop his left from being elevated down. Skin: Exposed areas without rash. Not pale. Not jaundice Neurologic:  alert & oriented X3.  Speech normal, gait appropriate for age and unassisted Strength symmetric and appropriate for age.  Psych: Cognition and judgment appear intact.  Cooperative with normal attention span and concentration.  Behavior appropriate. No anxious or depressed appearing.     Assessment    Assessment Prediabetes A1c 6.0 05-2012 Allergic rhinitis H/o thrombocytopenia Palpable aorta (US abdomen 2010, normal aorta)  PLAN: Here for CPX Prediabetes: Checking labs.  Encouraged healthy lifestyle. Shoulder pain, worse on the left: Suspect impingement or rotator cuff injury.  Recommend Tylenol and refer to sports medicine. RTC 1 year

## 2022-03-08 NOTE — Patient Instructions (Signed)
We are referring you to sports medicine regards your shoulder pain.  If you are not contacted in the next couple of weeks please let us know  Tylenol  500 mg OTC 2 tabs a day every 8 hours as needed for pain  Consider take COVID-vaccine  GO TO THE LAB : Get the blood work     GO TO THE FRONT DESK, PLEASE SCHEDULE YOUR APPOINTMENTS Come back for a physical exam in 1 year

## 2022-03-17 ENCOUNTER — Encounter: Payer: Self-pay | Admitting: Internal Medicine

## 2022-03-18 NOTE — Progress Notes (Unsigned)
   I, [name], LAT, ATC acting as a scribe for [name], MD.  Subjective:    CC: Bilateral shoulder and neck pain  HPI: Pt is a 62 y/o male c/o bilat shoulder and neck pain ongoing for 2-3 months, L>R. Pt c/o pain waking him up at night. Pt locates pain to   Radiates:  UE Numbness/tingling: yes UE Weakness: Aggravates: Treatments tried:  Pertinent review of Systems: ***  Relevant historical information: ***   Objective:   There were no vitals filed for this visit. General: Well Developed, well nourished, and in no acute distress.   MSK: ***  Lab and Radiology Results No results found for this or any previous visit (from the past 72 hour(s)). No results found.    Impression and Recommendations:    Assessment and Plan: 62 y.o. male with ***.  PDMP not reviewed this encounter. No orders of the defined types were placed in this encounter.  No orders of the defined types were placed in this encounter.   Discussed warning signs or symptoms. Please see discharge instructions. Patient expresses understanding.   ***

## 2022-03-21 ENCOUNTER — Ambulatory Visit (INDEPENDENT_AMBULATORY_CARE_PROVIDER_SITE_OTHER): Payer: 59

## 2022-03-21 ENCOUNTER — Ambulatory Visit: Payer: Self-pay

## 2022-03-21 ENCOUNTER — Ambulatory Visit (INDEPENDENT_AMBULATORY_CARE_PROVIDER_SITE_OTHER): Payer: 59 | Admitting: Family Medicine

## 2022-03-21 VITALS — BP 172/74 | HR 78 | Ht 64.0 in | Wt 125.0 lb

## 2022-03-21 DIAGNOSIS — G5603 Carpal tunnel syndrome, bilateral upper limbs: Secondary | ICD-10-CM | POA: Diagnosis not present

## 2022-03-21 DIAGNOSIS — M25512 Pain in left shoulder: Secondary | ICD-10-CM

## 2022-03-21 DIAGNOSIS — G8929 Other chronic pain: Secondary | ICD-10-CM | POA: Diagnosis not present

## 2022-03-21 DIAGNOSIS — M25511 Pain in right shoulder: Secondary | ICD-10-CM

## 2022-03-21 NOTE — Patient Instructions (Addendum)
Thank you for coming in today.   Please get an Xray today before you leave   You received an injection today. Seek immediate medical attention if the joint becomes red, extremely painful, or is oozing fluid.   Use carpal tunnel wrist brace at bedtime.   Please complete the exercises that the athletic trainer went over with you:  View at [email or web address] using code: [id number]  Check back in 6 weeks

## 2022-03-22 NOTE — Progress Notes (Signed)
Right shoulder x-ray shows some mild arthritis changes.

## 2022-03-22 NOTE — Progress Notes (Signed)
Left shoulder x-ray shows some mild arthritis changes.

## 2022-04-28 NOTE — Progress Notes (Signed)
I, Joseph Sexton, LAT, ATC acting as a scribe for Joseph Leader, MD.  Joseph Sexton is a 62 y.o. male who presents to [place] at [facility] today for 6-wk f/u bilat shoulder pain. Pt was last seen by Dr. Georgina Snell on 03/21/22 and was given L subacromial steroid injection and taught HEP. Today, pt reports bilat shoulder pain has cont, esp w/ aBd, but is less severe. Pt notes he is able to sleep w/out pain. Pt has 2 very physically demanding jobs.  Additionally at the last visit about 6 weeks ago he was seen for carpal tunnel syndrome bilaterally.  He was recommended to use night splint which he has been using.  This helped some.  He still has some paresthesias.  He denies any weakness.  Dx imaging: 03/21/22 R & L shoulder XR  Pertinent review of systems: No fevers or chills  Relevant historical information: Hyperglycemia prediabetes   Exam:  BP (!) 162/86   Pulse 92   Ht 5' 4"$  (1.626 m)   Wt 124 lb (56.2 kg)   SpO2 97%   BMI 21.28 kg/m  General: Well Developed, well nourished, and in no acute distress.   MSK: Left shoulder: Normal-appearing normal motion pain with abduction. Strength: 4/5 abduction and external rotation 5/5 internal rotation.  Positive Hawkins and Neer's test.  Negative Yergason's and speeds test.  Right shoulder: Normal-appearing normal motion.  Intact strength negative Yergason's and speeds test.  Hands and carpal tunnel bilaterally: Swelling and degenerative changes MCPs and PIPs DIPs.  Mild Tinel's bilateral carpal tunnel.  Mild Phalen's test positive bilateral carpal tunnel. Grip strength is intact.    Lab and Radiology Results  Narrative & Impression  CLINICAL DATA:  Bilateral shoulder pain for 2 months. No known injury.   EXAM: LEFT SHOULDER - 2+ VIEW   COMPARISON:  None Available.   FINDINGS: Minimal peripheral glenoid degenerative osteophytosis. Minimal glenohumeral joint space narrowing. Mild peripheral acromioclavicular  degenerative osteophytosis. No acute fracture or dislocation. The visualized portion of the left lung is unremarkable.   IMPRESSION: Mild glenohumeral and acromioclavicular osteoarthritis.     Electronically Signed   By: Yvonne Kendall M.D.   On: 03/21/2022 09:33   EXAM: RIGHT SHOULDER - 2+ VIEW   COMPARISON:  None Available.   FINDINGS: Minimal peripheral glenoid degenerative osteophytes. Mild acromioclavicular joint space narrowing and predominantly superiorly oriented peripheral osteophytosis. No acute fracture or dislocation.   IMPRESSION: Mild acromioclavicular and minimal glenohumeral osteoarthritis.     Electronically Signed   By: Yvonne Kendall M.D.   On: 03/21/2022 09:34 I, Joseph Sexton, personally (independently) visualized and performed the interpretation of the images attached in this note.    Assessment and Plan: 62 y.o. male with bilateral shoulder pain left worse than right.  Pain thought to be due to rotator cuff impingement and rotator cuff tendinitis.  He had a subacromial injection about 6 weeks ago.  Continue home exercise program and can repeat that injection at the 41-monthmark which would be mid to early April. Ideally I would be using physical therapy here but he just does not have enough time in his day to go to physical therapy.  Additionally has symptoms consistent with bilateral carpal tunnel syndrome.  Continue the use of wrist splints at night bilaterally for carpal tunnel syndrome.  I can proceed with bilateral carpal tunnel injection anytime he would like me to.  He does not think his symptoms are bad enough right now to proceed to  injection.     PDMP not reviewed this encounter. No orders of the defined types were placed in this encounter.  No orders of the defined types were placed in this encounter.    Discussed warning signs or symptoms. Please see discharge instructions. Patient expresses understanding.   The above documentation has  been reviewed and is accurate and complete Joseph Sexton, M.D.

## 2022-05-02 ENCOUNTER — Ambulatory Visit: Payer: Self-pay

## 2022-05-02 ENCOUNTER — Ambulatory Visit (INDEPENDENT_AMBULATORY_CARE_PROVIDER_SITE_OTHER): Payer: 59 | Admitting: Family Medicine

## 2022-05-02 VITALS — BP 162/86 | HR 92 | Ht 64.0 in | Wt 124.0 lb

## 2022-05-02 DIAGNOSIS — G5603 Carpal tunnel syndrome, bilateral upper limbs: Secondary | ICD-10-CM | POA: Diagnosis not present

## 2022-05-02 DIAGNOSIS — G8929 Other chronic pain: Secondary | ICD-10-CM | POA: Diagnosis not present

## 2022-05-02 DIAGNOSIS — M25511 Pain in right shoulder: Secondary | ICD-10-CM | POA: Diagnosis not present

## 2022-05-02 DIAGNOSIS — M25512 Pain in left shoulder: Secondary | ICD-10-CM

## 2022-05-02 NOTE — Patient Instructions (Addendum)
Thank you for coming in today.   Continue the wrist brace for carpal tunnel.   Continue the home exercises.   I can do another injection in the shoulder in mid April if needed.    I can inject your wrist for carpal tunnel any time.   Let me know what you need in the future.

## 2022-07-01 NOTE — Progress Notes (Unsigned)
   Rubin Payor, PhD, LAT, ATC acting as a scribe for Clementeen Graham, MD.  Grigor Lipschutz is a 62 y.o. male who presents to [facility] today for f/u bilateral shoulder pain.  Patient was last seen by Dr. Denyse Amass on 05/02/2022 and was advised to return mid to early April for repeat subacromial steroid injections and continue HEP. Last bilat subacromial injections were on 03/21/22.   Today, pt reports bilat shoulder pain cont. He feels that the prior injection were helpful. He states that holding his arms in aBd for prolonged periods is bothersome and pain w/ ER/IR.   Dx imaging: 03/21/22 R & L shoulder XR   Pertinent review of systems: No fevers or chills  Relevant historical information: Hyperglycemia.  Patient has 2 jobs and has trouble attending physical therapy.   Exam:  BP 126/68   Pulse 69   Ht  (1.626 m)   Wt 123 lb (55.8 kg)   SpO2 98%   BMI 21.11 kg/m  General: Well Developed, well nourished, and in no acute distress.   MSK: Left shoulder normal-appearing normal motion.  Pain with abduction.  Positive Hawkins and Neer's test.  Strength diminished to abduction and external rotation 4/5.    Lab and Radiology Results  EXAM: LEFT SHOULDER - 2+ VIEW   COMPARISON:  None Available.   FINDINGS: Minimal peripheral glenoid degenerative osteophytosis. Minimal glenohumeral joint space narrowing. Mild peripheral acromioclavicular degenerative osteophytosis. No acute fracture or dislocation. The visualized portion of the left lung is unremarkable.   IMPRESSION: Mild glenohumeral and acromioclavicular osteoarthritis.     Electronically Signed   By: Neita Garnet M.D.   On: 03/21/2022 09:33 I, Clementeen Graham, personally (independently) visualized and performed the interpretation of the images attached in this note.     Assessment and Plan: 62 y.o. male with chronic left shoulder pain thought to be due to impingement and bursitis.  Improving  3 months after an injection and home exercise program.  He still has some pain.  Talked about options.  He would like to avoid an repeat injection or physical therapy at this time.  That leaves [place] with continued home exercise program.  Check back as needed.  Happy to repeat injection anytime if you would like.   PDMP not reviewed this encounter. Orders Placed This Encounter  Procedures   [place] LIMITED JOINT SPACE STRUCTURES UP BILAT(NO LINKED CHARGES)    Order Specific Question:   Reason for Exam (SYMPTOM  OR DIAGNOSIS REQUIRED)    Answer:   bilateral shoulder pain    Order Specific Question:   Preferred imaging location?    Answer:   [facility]   No orders of the defined types were placed in this encounter.    Discussed warning signs or symptoms. Please see discharge instructions. Patient expresses understanding.  The above documentation has been reviewed and is accurate and complete Clementeen Graham, M.D.

## 2022-07-04 ENCOUNTER — Other Ambulatory Visit: Payer: Self-pay

## 2022-07-04 ENCOUNTER — Ambulatory Visit (INDEPENDENT_AMBULATORY_CARE_PROVIDER_SITE_OTHER): Payer: 59 | Admitting: Family Medicine

## 2022-07-04 VITALS — BP 126/68 | HR 69 | Ht 64.0 in | Wt 123.0 lb

## 2022-07-04 DIAGNOSIS — M25511 Pain in right shoulder: Secondary | ICD-10-CM

## 2022-07-04 DIAGNOSIS — M25512 Pain in left shoulder: Secondary | ICD-10-CM

## 2022-07-04 DIAGNOSIS — G8929 Other chronic pain: Secondary | ICD-10-CM

## 2022-07-04 NOTE — Patient Instructions (Signed)
Thank you for coming in today.   Return as needed.   Rehabilitacin del sndrome de pinzamiento del hombro Shoulder Impingement Syndrome Rehab Pregunte al mdico qu ejercicios son seguros para usted. Haga los ejercicios exactamente como se lo haya indicado el mdico y gradelos como se lo hayan indicado. Es normal sentir un leve estiramiento, tironeo, opresin o Dentist al [employer] ejercicios. Detngase de inmediato si siente un dolor repentino o Community education officer. No comience a hacer estos ejercicios hasta que se lo indique el mdico. Ejercicio de elongacin y amplitud de movimiento Este ejercicio calienta los msculos y las articulaciones, y mejora la movilidad y la flexibilidad del hombro. Adems, ayuda a Engineer, materials y la rigidez. Aduccin horizontal pasiva En la aduccin pasiva, se [place] la otra mano para mover el brazo [employer] cuerpo. El brazo lesionado no se mueve por s solo. En este movimiento, el brazo se mueve por delante del cuerpo en el plano horizontal (aduccin horizontal). Sintese o prese, y lleve el brazo izquierdo/derecho con el codo extendido [employer] hombro opuesto, cruzando el pecho. Detngase cuando sienta una elongacin suave atrs del hombro y la parte superior del brazo. Mantenga el brazo a la altura del hombro. Mantenga el brazo lo ms cerca que pueda de su cuerpo, sin Cabin crew. Mantenga esta posicin durante __________ segundos. Vuelva lentamente a la posicin inicial. Repita __________ veces. Realice este ejercicio __________ veces al da. Ejercicios de fortalecimiento Estos ejercicios fortalecen el hombro y le otorgan resistencia. La resistencia es la capacidad de usar los msculos durante un tiempo prolongado, incluso despus de que se cansen. Rotacin externa, ejercicio isomtrico Este es un ejercicio en el que usted debe presionar el dorso de la mueca contra el marco de una puerta sin mover la articulacin del hombro (ejercicio  isomtrico). Prese o sintese en la entrada de [place], de frente al marco de la puerta. Flexione el codo izquierdo/derecho y Systems analyst de atrs de la Advice worker contra el marco de la [place]. Solo la parte de atrs de la mueca debe tocar el marco. Mantenga la parte superior del brazo al costado del cuerpo. Presione suavemente la [employer] contra el marco de la puerta, como si tratara de empujar el brazo en direccin contraria al abdomen (rotacin externa). Presione tan fuerte como pueda sin Financial risk analyst. Evite encoger el hombro mientras presiona con la [place] contra el marco de la [place]. Mantenga los omplatos juntos, llvelos hacia el centro de la espalda. Mantenga esta posicin durante __________ segundos. Afloje lentamente la tensin y relaje los msculos por completo antes de repetir el ejercicio. Repita __________ veces. Realice este ejercicio __________ veces al da. Rotacin interna, ejercicio isomtrico Este es un ejercicio en el que usted debe presionar la palma de la mano contra el marco de una puerta sin mover la articulacin del hombro (isomtrico). Prese o sintese en la entrada de [place], de frente al marco de la puerta. Flexione el codo izquierdo/derecho y Immunologist palma de la mano contra el marco de la [place]. Solo la palma debe tocar el marco. Mantenga la parte superior del brazo al costado del cuerpo. Presione suavemente la mano contra el marco de la [place], como si tratara de empujar el brazo hacia el abdomen (rotacin interna). Presione tan fuerte como pueda sin Financial risk analyst. Evite encoger el hombro mientras presiona con la mano sobre el marco de la [place]. Mantenga los omplatos juntos, llvelos hacia el centro de la espalda. Mantenga esta  posicin durante __________ segundos. Afloje lentamente la tensin y relaje los msculos por completo antes de repetir el ejercicio. Repita __________ veces. Realice este ejercicio __________ veces al da. Protraccin escapular,  en decbito supino, ejercicio isotnico  Acustese boca arriba sobre una superficie firme (posicin supina). Sostenga una pesa de __________ con la mano izquierda/derecha. Eleve el brazo izquierdo/derecho en el aire, de modo que la mano est directamente por encima de la articulacin del hombro. Empuje con la pesa en el aire de forma que el hombro (escpula) se levante de la superficie sobre la que est recostado. La escpula empujar hacia arriba o hacia adelante (protraccin). No mueva la cabeza, el cuello ni la espalda. Mantenga esta posicin durante __________ segundos. Vuelva lentamente a la posicin inicial. Relaje totalmente los msculos antes de repetir el ejercicio. Repita __________ veces. Realice este ejercicio __________ veces al da. Retraccin escapular, ejercicio isotnico  Sintese en una silla estable que no tenga apoyabrazos, o pngase de pie. Ate una banda para ejercicios a un objeto estable que est frente a usted, de modo que la banda est a la altura del hombro. Sostenga un extremo de la banda para ejercicios en cada mano. Junte los omplatos Careers adviser) y [employer] codos ligeramente hacia atrs. No encoja los hombros hacia arriba al hacerlo. Mantenga esta posicin durante __________ segundos. Vuelva lentamente a la posicin inicial. Repita __________ veces. Realice este ejercicio __________ veces al da. Extensin del hombro, ejercicio isotnico  Sintese en una silla estable que no tenga apoyabrazos, o pngase de pie. Ate una banda para ejercicios a un objeto estable que est frente a usted, de modo que la banda est por encima de la altura del hombro. Sostenga un extremo de la banda para ejercicios en cada mano. Extienda los codos y [employer] manos a la altura de los hombros. [employer] omplatos y baje las manos hacia los costados de los muslos (extensin). Detngase cuando las manos estn en la misma posicin en ambos costados. No deje que las manos vayan hacia  atrs del cuerpo. Mantenga esta posicin durante __________ segundos. Vuelva lentamente a la posicin inicial. Repita __________ veces. Realice este ejercicio __________ veces al da. Esta informacin no tiene Theme park manager el consejo del mdico. Asegrese de hacerle al mdico cualquier pregunta que tenga. Document Revised: 12/14/2021 Document Reviewed: 12/14/2021 Elsevier Patient Education  2023 [employer].

## 2023-03-13 ENCOUNTER — Encounter: Payer: Self-pay | Admitting: Internal Medicine

## 2023-03-13 ENCOUNTER — Ambulatory Visit (INDEPENDENT_AMBULATORY_CARE_PROVIDER_SITE_OTHER): Payer: 59 | Admitting: Internal Medicine

## 2023-03-13 VITALS — BP 120/66 | HR 65 | Temp 98.1°F | Resp 16 | Ht 64.0 in | Wt 124.4 lb

## 2023-03-13 DIAGNOSIS — R739 Hyperglycemia, unspecified: Secondary | ICD-10-CM | POA: Diagnosis not present

## 2023-03-13 DIAGNOSIS — Z23 Encounter for immunization: Secondary | ICD-10-CM | POA: Diagnosis not present

## 2023-03-13 DIAGNOSIS — Z0000 Encounter for general adult medical examination without abnormal findings: Secondary | ICD-10-CM | POA: Diagnosis not present

## 2023-03-13 LAB — COMPREHENSIVE METABOLIC PANEL
ALT: 14 U/L (ref 0–53)
AST: 17 U/L (ref 0–37)
Albumin: 4.3 g/dL (ref 3.5–5.2)
Alkaline Phosphatase: 82 U/L (ref 39–117)
BUN: 28 mg/dL — ABNORMAL HIGH (ref 6–23)
CO2: 30 meq/L (ref 19–32)
Calcium: 9 mg/dL (ref 8.4–10.5)
Chloride: 103 meq/L (ref 96–112)
Creatinine, Ser: 0.74 mg/dL (ref 0.40–1.50)
GFR: 96.11 mL/min (ref >60.00)
Glucose, Bld: 87 mg/dL (ref 70–99)
Potassium: 4.8 meq/L (ref 3.5–5.1)
Sodium: 139 meq/L (ref 135–145)
Total Bilirubin: 1.1 mg/dL (ref 0.2–1.2)
Total Protein: 6.8 g/dL (ref 6.0–8.3)

## 2023-03-13 LAB — PSA: PSA: 0.57 ng/mL (ref 0.10–4.00)

## 2023-03-13 LAB — CBC WITH DIFFERENTIAL/PLATELET
Basophils Absolute: 0 10*3/uL (ref 0.0–0.1)
Basophils Relative: 0.3 % (ref 0.0–3.0)
Eosinophils Absolute: 0 10*3/uL (ref 0.0–0.7)
Eosinophils Relative: 0.6 % (ref 0.0–5.0)
HCT: 39.9 % (ref 39.0–52.0)
Hemoglobin: 13.6 g/dL (ref 13.0–17.0)
Lymphocytes Relative: 32.4 % (ref 12.0–46.0)
Lymphs Abs: 1.7 10*3/uL (ref 0.7–4.0)
MCHC: 34.1 g/dL (ref 30.0–36.0)
MCV: 93.3 fL (ref 78.0–100.0)
Monocytes Absolute: 0.5 10*3/uL (ref 0.1–1.0)
Monocytes Relative: 9.9 % (ref 3.0–12.0)
Neutro Abs: 3 10*3/uL (ref 1.4–7.7)
Neutrophils Relative %: 56.8 % (ref 43.0–77.0)
Platelets: 253 10*3/uL (ref 150.0–400.0)
RBC: 4.28 Mil/uL (ref 4.22–5.81)
RDW: 16.2 % — ABNORMAL HIGH (ref 11.5–15.5)
WBC: 5.3 10*3/uL (ref 4.0–10.5)

## 2023-03-13 LAB — LIPID PANEL
Cholesterol: 205 mg/dL — ABNORMAL HIGH (ref 0–200)
HDL: 84.7 mg/dL (ref >39.00)
LDL Cholesterol: 103 mg/dL — ABNORMAL HIGH (ref 0–99)
NonHDL: 120.54
Total CHOL/HDL Ratio: 2
Triglycerides: 89 mg/dL (ref 0.0–149.0)
VLDL: 17.8 mg/dL (ref 0.0–40.0)

## 2023-03-13 LAB — TSH: TSH: 4.45 u[IU]/mL (ref 0.35–5.50)

## 2023-03-13 LAB — HEMOGLOBIN A1C: Hgb A1c MFr Bld: 6.1 % (ref 4.6–6.5)

## 2023-03-13 NOTE — Patient Instructions (Signed)
Please consider taking a COVID-vaccine    GO TO THE LAB : Get the blood work     Next visit with me in 1 year for a physical exam    Please schedule it at the front desk

## 2023-03-13 NOTE — Assessment & Plan Note (Signed)
Here for CPX Prediabetes: Checking A1c RTC 1 year

## 2023-03-13 NOTE — Progress Notes (Signed)
Subjective:    Patient ID: Joseph Sexton, male    DOB: 1960/04/06, 62 y.o.   MRN: [id number]  DOS:  03/13/2023 Type of visit - description: CPX  Here for CPX. Has no concerns. Has no symptoms  Wt Readings from Last 3 Encounters:  03/13/23 124 lb 6 oz (56.4 kg)  07/04/22 123 lb (55.8 kg)  05/02/22 124 lb (56.2 kg)    Review of Systems  Other than above, a 14 point review of systems is negative     Past Medical History:  Diagnosis Date   Allergy    seasonal   Anemia    H/O abnormal weight loss    Hyperglycemia 05/26/2014   Inguinal hernia, bilateral    Thrombocytopenia (HCC)    Per chart review    Past Surgical History:  Procedure Laterality Date   EYE SURGERY  1985   unsure of exact surgery   HERNIA REPAIR Bilateral 2011   Social History   Socioeconomic History   Marital status: Married    Spouse name: Not on file   Number of children: 3   Years of education: Not on file   Highest education level: Not on file  Occupational History   Occupation: works at [employer]   Occupation: works at [employer]   Tobacco Use   Smoking status: Never   Smokeless tobacco: Never  Substance and Sexual Activity   Alcohol use: Yes    Alcohol/week: 0.0 standard drinks of alcohol    Comment: Social Drinker   Drug use: No   Sexual activity: Not on file  Other Topics Concern   Not on file  Social History Narrative   Household: pt, wife, 2 children, 1 g-child   Born in [place] , in [place] since 1985   2 daughters, one son   Social Drivers of Corporate investment banker Strain: Not on Ship broker Insecurity: Not on file  Transportation Needs: Not on file  Physical Activity: Not on file  Stress: Not on file  Social Connections: Not on file  Intimate Partner Violence: Not on file    Current Outpatient Medications  Medication Instructions   cholecalciferol (VITAMIN D) 1,000 Units, Daily   Omega-3 Fatty Acids (FISH OIL) 1200 MG CAPS Take by mouth.   vitamin C  1,000 mg, Daily       Objective:   Physical Exam BP 120/66   Pulse 65   Temp 98.1 F (36.7 C) (Oral)   Resp 16   Ht 5\' 4"  (1.626 m)   Wt 124 lb 6 oz (56.4 kg)   SpO2 97%   BMI 21.35 kg/m  General: Well developed, NAD, slightly underweight but healthy-appearing Neck: No  thyromegaly  HEENT:  Normocephalic . Face symmetric, atraumatic Lungs:  CTA B Normal respiratory effort, no intercostal retractions, no accessory muscle use. Heart: RRR,  no murmur.  Abdomen:  Not distended, soft, non-tender. No rebound or rigidity.   Lower extremities: no pretibial edema bilaterally  Skin: Exposed areas without rash. Not pale. Not jaundice Neurologic:  alert & oriented X3.  Speech normal, gait appropriate for age and unassisted Strength symmetric and appropriate for age.  Psych: Cognition and judgment appear intact.  Cooperative with normal attention span and concentration.  Behavior appropriate. No anxious or depressed appearing.     Assessment    Assessment Prediabetes A1c 6.0 05-2012 Allergic rhinitis H/o thrombocytopenia Palpable aorta (US abdomen 2010, normal aorta)  PLAN: Here for CPX -Td: 06-979 -  pnm  23: 2017 - Shingrix x2 - flu shot today -COVID vac: recommended, pros>cons  --CCS: colonoscopy ~ 09-2010,  10 years. Colonoscopy 11-2021, no polyps, 10 years --Prostate cancer screening:   check psa , no sxs -- Lifestyle: Very active at work, diet is healthy most of the time. - Lab: CMP FLP CBC A1c TSH PSA - Healthcare POA: Information provided in Spanish Prediabetes: Checking A1c RTC 1 year

## 2023-03-13 NOTE — Assessment & Plan Note (Signed)
Here for CPX -Td: [phone] -  pnm 23: 2017 - Shingrix x2 - flu shot today -COVID vac: recommended, pros>cons  --CCS: colonoscopy ~ 09-2010,  10 years. Colonoscopy 11-2021, no polyps, 10 years --Prostate cancer screening:   check psa , no sxs -- Lifestyle: Very active at work, diet is healthy most of the time. - Lab: CMP FLP CBC A1c TSH PSA - Healthcare POA: Information provided in Spanish

## 2024-03-13 ENCOUNTER — Encounter: Payer: Self-pay | Admitting: Internal Medicine

## 2024-03-13 ENCOUNTER — Ambulatory Visit (INDEPENDENT_AMBULATORY_CARE_PROVIDER_SITE_OTHER): Admitting: Internal Medicine

## 2024-03-13 VITALS — BP 116/72 | HR 84 | Temp 98.8°F | Resp 16 | Ht 65.0 in | Wt 138.0 lb

## 2024-03-13 DIAGNOSIS — R739 Hyperglycemia, unspecified: Secondary | ICD-10-CM

## 2024-03-13 DIAGNOSIS — Z0000 Encounter for general adult medical examination without abnormal findings: Secondary | ICD-10-CM

## 2024-03-13 DIAGNOSIS — Z23 Encounter for immunization: Secondary | ICD-10-CM

## 2024-03-13 DIAGNOSIS — R7303 Prediabetes: Secondary | ICD-10-CM | POA: Diagnosis not present

## 2024-03-13 NOTE — Progress Notes (Signed)
" ° °  Subjective:    Patient ID: Joseph Sexton, male    DOB: [birth year], 63 y.o.   MRN: [id number]  DOS:  03/13/2024 Here for CPX Discussed the use of AI scribe software for clinical note transcription with the patient, who gave verbal consent to proceed.  History of Present Illness Joseph Sexton is a 63 year old male who presents for an annual physical exam.  General health status - Feels well with no specific concerns - No recent hospitalizations or surgeries  Cardiopulmonary symptoms - No chest pain - No shortness of breath  Gastrointestinal and genitourinary symptoms - No gastrointestinal symptoms - No urinary issues  Laboratory findings - LDL increased from 86 to 103 over the past year - Slightly elevated blood sugar on prior labs  Supplement use - Takes vitamin D and fish oil   Wt Readings from Last 3 Encounters:  03/13/24 138 lb (62.6 kg)  03/13/23 124 lb 6 oz (56.4 kg)  07/04/22 123 lb (55.8 kg)   Review of Systems  Other than above, a 14 point review of systems is negative    Past Medical History:  Diagnosis Date   Allergy    seasonal   Anemia    H/O abnormal weight loss    Hyperglycemia 05/26/2014   Inguinal hernia, bilateral    Thrombocytopenia    Per chart review    Past Surgical History:  Procedure Laterality Date   EYE SURGERY  1985   unsure of exact surgery   HERNIA REPAIR Bilateral 2011    Current Outpatient Medications  Medication Instructions   cholecalciferol (VITAMIN D) 1,000 Units, Daily   Omega-3 Fatty Acids (FISH OIL) 1200 MG CAPS Take by mouth.   vitamin C 1,000 mg, Daily       Objective:   Physical Exam BP 116/72 (BP Location: Right Arm, Patient Position: Sitting, Cuff Size: Small)   Pulse 84   Temp 98.8 F (37.1 C) (Oral)   Resp 16   Ht 5' 5 (1.651 m)   Wt 138 lb (62.6 kg)   SpO2 100%   BMI 22.96 kg/m  General: Well developed, NAD, BMI noted Neck: No  thyromegaly  HEENT:  Normocephalic . Face symmetric,  atraumatic Lungs:  CTA B Normal respiratory effort, no intercostal retractions, no accessory muscle use. Heart: RRR,  no murmur.  Abdomen:  Not distended, soft, non-tender. No rebound or rigidity.   Lower extremities: no pretibial edema bilaterally  Skin: Exposed areas without rash. Not pale. Not jaundice Neurologic:  alert & oriented X3.  Speech normal, gait appropriate for age and unassisted Strength symmetric and appropriate for age.  Psych: Cognition and judgment appear intact.  Cooperative with normal attention span and concentration.  Behavior appropriate. No anxious or depressed appearing.     Assessment   Assessment Prediabetes A1c 6.0 05-2012 Allergic rhinitis H/o thrombocytopenia Palpable aorta (US  abdomen 2010, normal aorta)  Assessment & Plan  Here for CPX -Td: [phone] -  pnm 23: 2017 - Shingrix x2 - flu shot today -COVID vac: recommended, pros>cons  --CCS: colonoscopy ~ 09-2010,  10 years. Colonoscopy 11-2021, no polyps, 10 years --Prostate cancer screening:   check psa , no sxs -- Lifestyle: Remains active, diet described as regular, recommend to decrease carbohydrate intake and fats. - Lab: See orders - Healthcare POA: Information provided in Spanish  Other issues: Prediabetes: Checking A1c Cholesterol: Last LDL elevated, encouraged heart healthy diet, recheck FLP. RTC 1 year        "

## 2024-03-13 NOTE — Patient Instructions (Addendum)
 GO TO THE LAB :  Get the blood work    Then, go to the front desk for the checkout Please make an appointment for another physical exam in 1 year      ADULT WELLNESS VISIT:   Routine visit with no new health concerns. Blood pressure excellent. No significant family history. No smoking.  -You received a flu vaccine today. -It is recommended that you get the COVID vaccine.  -Blood tests were ordered to check liver function, cholesterol, complete blood count (CBC), blood sugar, and prostate-specific antigen (PSA).  -Follow a low-fat, low-carbohydrate diet.  Cholesterol: Your LDL cholesterol increased from 86 to 103 over the past year.  -Make dietary changes to reduce fat and carbohydrate intake.  PREDIABETES: Your blood sugar levels previously indicated prediabetes. You do not have a current diabetes diagnosis.

## 2024-03-13 NOTE — Assessment & Plan Note (Signed)
"   Here for CPX  Other issues: Prediabetes: Checking A1c Cholesterol: Last LDL elevated, encouraged heart healthy diet, recheck FLP. RTC 1 year  "

## 2024-03-13 NOTE — Assessment & Plan Note (Signed)
"   Here for CPX -Td: [id number] -  pnm 23: 2017 - Shingrix x2 - flu shot today -COVID vac: recommended, pros>cons  --CCS: colonoscopy ~ 09-2010,  10 years. Colonoscopy 11-2021, no polyps, 10 years --Prostate cancer screening:   check psa , no sxs -- Lifestyle: Remains active, diet described as regular, recommend to decrease carbohydrate intake and fats. - Lab: See orders - Healthcare POA: Information provided in Spanish  "

## 2024-03-14 LAB — LIPID PANEL
Chol/HDL Ratio: 2.6 ratio (ref 0.0–5.0)
Cholesterol, Total: 193 mg/dL (ref 100–199)
HDL: 74 mg/dL
LDL Chol Calc (NIH): 101 mg/dL — ABNORMAL HIGH (ref 0–99)
Triglycerides: 105 mg/dL (ref 0–149)
VLDL Cholesterol Cal: 18 mg/dL (ref 5–40)

## 2024-03-14 LAB — COMPREHENSIVE METABOLIC PANEL WITH GFR
ALT: 18 IU/L (ref 0–44)
AST: 20 IU/L (ref 0–40)
Albumin: 4.4 g/dL (ref 3.9–4.9)
Alkaline Phosphatase: 89 IU/L (ref 47–123)
BUN/Creatinine Ratio: 26 — ABNORMAL HIGH (ref 10–24)
BUN: 19 mg/dL (ref 8–27)
Bilirubin Total: 0.7 mg/dL (ref 0.0–1.2)
CO2: 23 mmol/L (ref 20–29)
Calcium: 8.9 mg/dL (ref 8.6–10.2)
Chloride: 102 mmol/L (ref 96–106)
Creatinine, Ser: 0.74 mg/dL — ABNORMAL LOW (ref 0.76–1.27)
Globulin, Total: 2.4 g/dL (ref 1.5–4.5)
Glucose: 88 mg/dL (ref 70–99)
Potassium: 4.2 mmol/L (ref 3.5–5.2)
Sodium: 139 mmol/L (ref 134–144)
Total Protein: 6.8 g/dL (ref 6.0–8.5)
eGFR: 101 mL/min/1.73

## 2024-03-14 LAB — CBC WITH DIFFERENTIAL/PLATELET
Basophils Absolute: 0 x10E3/uL (ref 0.0–0.2)
Basos: 0 %
EOS (ABSOLUTE): 0 x10E3/uL (ref 0.0–0.4)
Eos: 0 %
Hematocrit: 37.5 % (ref 37.5–51.0)
Hemoglobin: 12.2 g/dL — ABNORMAL LOW (ref 13.0–17.7)
Immature Grans (Abs): 0 x10E3/uL (ref 0.0–0.1)
Immature Granulocytes: 0 %
Lymphocytes Absolute: 1.5 x10E3/uL (ref 0.7–3.1)
Lymphs: 21 %
MCH: 29.6 pg (ref 26.6–33.0)
MCHC: 32.5 g/dL (ref 31.5–35.7)
MCV: 91 fL (ref 79–97)
Monocytes Absolute: 0.9 x10E3/uL (ref 0.1–0.9)
Monocytes: 13 %
Neutrophils Absolute: 4.6 x10E3/uL (ref 1.4–7.0)
Neutrophils: 66 %
Platelets: 193 x10E3/uL (ref 150–450)
RBC: 4.12 x10E6/uL — ABNORMAL LOW (ref 4.14–5.80)
RDW: 15.8 % — ABNORMAL HIGH (ref 11.6–15.4)
WBC: 7 x10E3/uL (ref 3.4–10.8)

## 2024-03-14 LAB — HEMOGLOBIN A1C
Est. average glucose Bld gHb Est-mCnc: 117 mg/dL
Hgb A1c MFr Bld: 5.7 % — ABNORMAL HIGH (ref 4.8–5.6)

## 2024-03-14 LAB — PSA: Prostate Specific Ag, Serum: 0.8 ng/mL (ref 0.0–4.0)

## 2024-03-14 LAB — TSH: TSH: 2.77 u[IU]/mL (ref 0.450–4.500)

## 2024-03-17 ENCOUNTER — Ambulatory Visit: Payer: Self-pay | Admitting: Internal Medicine

## 2024-03-17 DIAGNOSIS — D649 Anemia, unspecified: Secondary | ICD-10-CM

## 2024-06-17 ENCOUNTER — Other Ambulatory Visit

## 2025-03-18 ENCOUNTER — Encounter: Admitting: Internal Medicine
